# Patient Record
Sex: Female | Born: 1970 | Race: White | Hispanic: No | Marital: Married | Smoking: Never smoker
Health system: Southern US, Community
[De-identification: ages and names within clinical notes are randomized; demographics above are authoritative.]

## PROBLEM LIST (undated history)

## (undated) DIAGNOSIS — K7689 Other specified diseases of liver: Secondary | ICD-10-CM

## (undated) HISTORY — PX: APPENDECTOMY: SHX54

## (undated) HISTORY — DX: Other specified diseases of liver: K76.89

## (undated) HISTORY — PX: STAPEDOTOMY: SHX2437

---

## 2001-10-16 ENCOUNTER — Other Ambulatory Visit: Admission: RE | Admit: 2001-10-16 | Discharge: 2001-10-16 | Payer: Self-pay | Admitting: Obstetrics and Gynecology

## 2002-12-26 ENCOUNTER — Other Ambulatory Visit: Admission: RE | Admit: 2002-12-26 | Discharge: 2002-12-26 | Payer: Self-pay | Admitting: Obstetrics and Gynecology

## 2004-02-25 ENCOUNTER — Other Ambulatory Visit: Admission: RE | Admit: 2004-02-25 | Discharge: 2004-02-25 | Payer: Self-pay | Admitting: Obstetrics and Gynecology

## 2005-03-24 ENCOUNTER — Other Ambulatory Visit: Admission: RE | Admit: 2005-03-24 | Discharge: 2005-03-24 | Payer: Self-pay | Admitting: Obstetrics and Gynecology

## 2013-10-15 ENCOUNTER — Ambulatory Visit (INDEPENDENT_AMBULATORY_CARE_PROVIDER_SITE_OTHER): Payer: BC Managed Care – PPO | Admitting: Sports Medicine

## 2013-10-15 ENCOUNTER — Ambulatory Visit
Admission: RE | Admit: 2013-10-15 | Discharge: 2013-10-15 | Disposition: A | Discharge status: Home / Self Care | Payer: BC Managed Care – PPO | Source: Ambulatory Visit | Attending: Sports Medicine | Admitting: Sports Medicine

## 2013-10-15 ENCOUNTER — Encounter: Payer: Self-pay | Admitting: Sports Medicine

## 2013-10-15 VITALS — BP 115/79 | HR 75 | Ht 69.0 in | Wt 170.0 lb

## 2013-10-15 DIAGNOSIS — M25561 Pain in right knee: Secondary | ICD-10-CM

## 2013-10-15 DIAGNOSIS — M25569 Pain in unspecified knee: Secondary | ICD-10-CM

## 2013-10-15 NOTE — Progress Notes (Addendum)
   Subjective:    Patient ID: Michele Mccarthy, female    DOB: [birth year], 43 y.o.   MRN: [id number]  Knee Pain   43 y.o. female presenting with several years right knee pain that has acutely worsened in the past 2-3 weeks. The pain is described as sharp, located infralateral patellar region, some radiation down leg, not to toes. 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 She also went for a walk 2 weeks prior at which time the pain was so bad she had to stop. After that episode she did ice and elevate her knee - which provided some relief. Pain exacerbated by knee flexion, weight-bearing. Denies numbness, tingling, trauma, fevers, change in activity. She has noticed some swelling over her area of discomfort.   Review of Systems Per HPI    Objective:   Physical Exam Filed Vitals:   10/15/13 1413  BP: 115/79  Pulse: 75  Gen: Well-developed, well-nourished individual in NAD, AAOx3 Knee: No erythema or effusion or obvious bony abnormalities.  Palpation with no warmth, no patellar, condylar tenderness, + lateral joint line tenderness ROM normal in flexion and extension and lower leg rotation; no crepitus Ligaments with solid consistent endpoints including ACL, PCL, LCL, MCL. Negative Mcmurray's and provocative meniscal tests. Non painful patellar compression. Patellar and quadriceps tendons unremarkable. Hamstring and quadriceps strength is normal. Weakness noted to hip abductors, most pronounced in gluteus medius  Gait assessment reveals pronation     Assessment & Plan:  Lahari was seen today for knee pain.  Diagnoses and associated orders for this visit:  Right knee pain Concern for right lateral meniscal involvement versus osteoarthritic process - Obtain R Knee AP/LAT W/Sunrise Right - Give right knee compression sleeve - Provide handout with hip abductor, hamstring,  quadriceps exercises - Have patient return in 4 weeks - Will review films At follow-up if improved, continue current regimen of exercises, sleeve; if worsened / no improvement then consider diagnostic intra-articular cortisone injection. Patient will also bring in a pair of walking shoes at followup (she only had sandals with her today). She may benefit from some arch support as well.   Joseph Berkshire, MD [facility] PGY-3  Addendum: X-rays reviewed. They're unremarkable. No significant degenerative changes seen.

## 2013-11-12 ENCOUNTER — Ambulatory Visit: Payer: BC Managed Care – PPO | Admitting: Sports Medicine

## 2015-06-27 DIAGNOSIS — Z79899 Other long term (current) drug therapy: Secondary | ICD-10-CM | POA: Diagnosis not present

## 2015-06-27 DIAGNOSIS — E039 Hypothyroidism, unspecified: Secondary | ICD-10-CM | POA: Diagnosis not present

## 2015-06-27 DIAGNOSIS — Z13220 Encounter for screening for lipoid disorders: Secondary | ICD-10-CM | POA: Diagnosis not present

## 2015-06-27 DIAGNOSIS — Z0000 Encounter for general adult medical examination without abnormal findings: Secondary | ICD-10-CM | POA: Diagnosis not present

## 2015-06-27 DIAGNOSIS — D649 Anemia, unspecified: Secondary | ICD-10-CM | POA: Diagnosis not present

## 2015-06-27 DIAGNOSIS — E538 Deficiency of other specified B group vitamins: Secondary | ICD-10-CM | POA: Diagnosis not present

## 2015-11-24 DIAGNOSIS — Z6828 Body mass index (BMI) 28.0-28.9, adult: Secondary | ICD-10-CM | POA: Diagnosis not present

## 2015-11-24 DIAGNOSIS — Z1231 Encounter for screening mammogram for malignant neoplasm of breast: Secondary | ICD-10-CM | POA: Diagnosis not present

## 2015-11-24 DIAGNOSIS — Z01419 Encounter for gynecological examination (general) (routine) without abnormal findings: Secondary | ICD-10-CM | POA: Diagnosis not present

## 2015-11-26 DIAGNOSIS — R1031 Right lower quadrant pain: Secondary | ICD-10-CM | POA: Diagnosis not present

## 2015-12-16 DIAGNOSIS — L723 Sebaceous cyst: Secondary | ICD-10-CM | POA: Diagnosis not present

## 2015-12-16 DIAGNOSIS — L304 Erythema intertrigo: Secondary | ICD-10-CM | POA: Diagnosis not present

## 2015-12-16 DIAGNOSIS — B078 Other viral warts: Secondary | ICD-10-CM | POA: Diagnosis not present

## 2016-08-13 DIAGNOSIS — Z0000 Encounter for general adult medical examination without abnormal findings: Secondary | ICD-10-CM | POA: Diagnosis not present

## 2016-08-13 DIAGNOSIS — Z79899 Other long term (current) drug therapy: Secondary | ICD-10-CM | POA: Diagnosis not present

## 2016-08-13 DIAGNOSIS — E039 Hypothyroidism, unspecified: Secondary | ICD-10-CM | POA: Diagnosis not present

## 2016-09-28 DIAGNOSIS — J069 Acute upper respiratory infection, unspecified: Secondary | ICD-10-CM | POA: Diagnosis not present

## 2016-11-24 DIAGNOSIS — Z01419 Encounter for gynecological examination (general) (routine) without abnormal findings: Secondary | ICD-10-CM | POA: Diagnosis not present

## 2016-11-24 DIAGNOSIS — Z6828 Body mass index (BMI) 28.0-28.9, adult: Secondary | ICD-10-CM | POA: Diagnosis not present

## 2016-11-25 ENCOUNTER — Other Ambulatory Visit: Payer: Self-pay | Admitting: Obstetrics and Gynecology

## 2016-11-25 DIAGNOSIS — N6459 Other signs and symptoms in breast: Secondary | ICD-10-CM

## 2016-11-30 ENCOUNTER — Ambulatory Visit
Admission: RE | Admit: 2016-11-30 | Discharge: 2016-11-30 | Disposition: A | Discharge status: Home / Self Care | Payer: BLUE CROSS/BLUE SHIELD | Source: Ambulatory Visit | Attending: Obstetrics and Gynecology | Admitting: Obstetrics and Gynecology

## 2016-11-30 DIAGNOSIS — N6489 Other specified disorders of breast: Secondary | ICD-10-CM | POA: Diagnosis not present

## 2016-11-30 DIAGNOSIS — R922 Inconclusive mammogram: Secondary | ICD-10-CM | POA: Diagnosis not present

## 2016-11-30 DIAGNOSIS — N6459 Other signs and symptoms in breast: Secondary | ICD-10-CM

## 2016-12-15 DIAGNOSIS — Z86010 Personal history of colonic polyps: Secondary | ICD-10-CM | POA: Diagnosis not present

## 2016-12-15 DIAGNOSIS — D126 Benign neoplasm of colon, unspecified: Secondary | ICD-10-CM | POA: Diagnosis not present

## 2016-12-15 DIAGNOSIS — K621 Rectal polyp: Secondary | ICD-10-CM | POA: Diagnosis not present

## 2016-12-21 DIAGNOSIS — D126 Benign neoplasm of colon, unspecified: Secondary | ICD-10-CM | POA: Diagnosis not present

## 2017-06-09 DIAGNOSIS — Z86018 Personal history of other benign neoplasm: Secondary | ICD-10-CM | POA: Diagnosis not present

## 2017-06-09 DIAGNOSIS — L814 Other melanin hyperpigmentation: Secondary | ICD-10-CM | POA: Diagnosis not present

## 2017-06-09 DIAGNOSIS — D2262 Melanocytic nevi of left upper limb, including shoulder: Secondary | ICD-10-CM | POA: Diagnosis not present

## 2017-06-09 DIAGNOSIS — Z808 Family history of malignant neoplasm of other organs or systems: Secondary | ICD-10-CM | POA: Diagnosis not present

## 2017-07-13 DIAGNOSIS — M9901 Segmental and somatic dysfunction of cervical region: Secondary | ICD-10-CM | POA: Diagnosis not present

## 2017-07-13 DIAGNOSIS — M9905 Segmental and somatic dysfunction of pelvic region: Secondary | ICD-10-CM | POA: Diagnosis not present

## 2017-07-13 DIAGNOSIS — M9902 Segmental and somatic dysfunction of thoracic region: Secondary | ICD-10-CM | POA: Diagnosis not present

## 2017-07-13 DIAGNOSIS — M9903 Segmental and somatic dysfunction of lumbar region: Secondary | ICD-10-CM | POA: Diagnosis not present

## 2017-07-14 DIAGNOSIS — M9903 Segmental and somatic dysfunction of lumbar region: Secondary | ICD-10-CM | POA: Diagnosis not present

## 2017-07-14 DIAGNOSIS — M9901 Segmental and somatic dysfunction of cervical region: Secondary | ICD-10-CM | POA: Diagnosis not present

## 2017-07-14 DIAGNOSIS — M9902 Segmental and somatic dysfunction of thoracic region: Secondary | ICD-10-CM | POA: Diagnosis not present

## 2017-07-14 DIAGNOSIS — M9905 Segmental and somatic dysfunction of pelvic region: Secondary | ICD-10-CM | POA: Diagnosis not present

## 2017-07-18 DIAGNOSIS — M9903 Segmental and somatic dysfunction of lumbar region: Secondary | ICD-10-CM | POA: Diagnosis not present

## 2017-07-18 DIAGNOSIS — M9905 Segmental and somatic dysfunction of pelvic region: Secondary | ICD-10-CM | POA: Diagnosis not present

## 2017-07-18 DIAGNOSIS — M9902 Segmental and somatic dysfunction of thoracic region: Secondary | ICD-10-CM | POA: Diagnosis not present

## 2017-07-18 DIAGNOSIS — M9901 Segmental and somatic dysfunction of cervical region: Secondary | ICD-10-CM | POA: Diagnosis not present

## 2017-07-20 DIAGNOSIS — M9901 Segmental and somatic dysfunction of cervical region: Secondary | ICD-10-CM | POA: Diagnosis not present

## 2017-07-20 DIAGNOSIS — M9905 Segmental and somatic dysfunction of pelvic region: Secondary | ICD-10-CM | POA: Diagnosis not present

## 2017-07-20 DIAGNOSIS — M9903 Segmental and somatic dysfunction of lumbar region: Secondary | ICD-10-CM | POA: Diagnosis not present

## 2017-07-20 DIAGNOSIS — M9902 Segmental and somatic dysfunction of thoracic region: Secondary | ICD-10-CM | POA: Diagnosis not present

## 2017-07-25 DIAGNOSIS — M9902 Segmental and somatic dysfunction of thoracic region: Secondary | ICD-10-CM | POA: Diagnosis not present

## 2017-07-25 DIAGNOSIS — M9901 Segmental and somatic dysfunction of cervical region: Secondary | ICD-10-CM | POA: Diagnosis not present

## 2017-07-25 DIAGNOSIS — M9903 Segmental and somatic dysfunction of lumbar region: Secondary | ICD-10-CM | POA: Diagnosis not present

## 2017-07-25 DIAGNOSIS — M9905 Segmental and somatic dysfunction of pelvic region: Secondary | ICD-10-CM | POA: Diagnosis not present

## 2017-07-27 DIAGNOSIS — M9901 Segmental and somatic dysfunction of cervical region: Secondary | ICD-10-CM | POA: Diagnosis not present

## 2017-07-27 DIAGNOSIS — M9902 Segmental and somatic dysfunction of thoracic region: Secondary | ICD-10-CM | POA: Diagnosis not present

## 2017-07-27 DIAGNOSIS — M9905 Segmental and somatic dysfunction of pelvic region: Secondary | ICD-10-CM | POA: Diagnosis not present

## 2017-07-27 DIAGNOSIS — M9903 Segmental and somatic dysfunction of lumbar region: Secondary | ICD-10-CM | POA: Diagnosis not present

## 2017-08-01 DIAGNOSIS — M9905 Segmental and somatic dysfunction of pelvic region: Secondary | ICD-10-CM | POA: Diagnosis not present

## 2017-08-01 DIAGNOSIS — M9901 Segmental and somatic dysfunction of cervical region: Secondary | ICD-10-CM | POA: Diagnosis not present

## 2017-08-01 DIAGNOSIS — M9903 Segmental and somatic dysfunction of lumbar region: Secondary | ICD-10-CM | POA: Diagnosis not present

## 2017-08-01 DIAGNOSIS — M9902 Segmental and somatic dysfunction of thoracic region: Secondary | ICD-10-CM | POA: Diagnosis not present

## 2017-08-03 DIAGNOSIS — M9901 Segmental and somatic dysfunction of cervical region: Secondary | ICD-10-CM | POA: Diagnosis not present

## 2017-08-03 DIAGNOSIS — M9903 Segmental and somatic dysfunction of lumbar region: Secondary | ICD-10-CM | POA: Diagnosis not present

## 2017-08-03 DIAGNOSIS — M9902 Segmental and somatic dysfunction of thoracic region: Secondary | ICD-10-CM | POA: Diagnosis not present

## 2017-08-03 DIAGNOSIS — M9905 Segmental and somatic dysfunction of pelvic region: Secondary | ICD-10-CM | POA: Diagnosis not present

## 2017-08-08 DIAGNOSIS — M9902 Segmental and somatic dysfunction of thoracic region: Secondary | ICD-10-CM | POA: Diagnosis not present

## 2017-08-08 DIAGNOSIS — M9901 Segmental and somatic dysfunction of cervical region: Secondary | ICD-10-CM | POA: Diagnosis not present

## 2017-08-08 DIAGNOSIS — M9905 Segmental and somatic dysfunction of pelvic region: Secondary | ICD-10-CM | POA: Diagnosis not present

## 2017-08-08 DIAGNOSIS — M9903 Segmental and somatic dysfunction of lumbar region: Secondary | ICD-10-CM | POA: Diagnosis not present

## 2017-10-05 DIAGNOSIS — E039 Hypothyroidism, unspecified: Secondary | ICD-10-CM | POA: Diagnosis not present

## 2017-10-05 DIAGNOSIS — E538 Deficiency of other specified B group vitamins: Secondary | ICD-10-CM | POA: Diagnosis not present

## 2017-10-05 DIAGNOSIS — Z0000 Encounter for general adult medical examination without abnormal findings: Secondary | ICD-10-CM | POA: Diagnosis not present

## 2017-10-18 DIAGNOSIS — E538 Deficiency of other specified B group vitamins: Secondary | ICD-10-CM | POA: Diagnosis not present

## 2017-11-01 DIAGNOSIS — E538 Deficiency of other specified B group vitamins: Secondary | ICD-10-CM | POA: Diagnosis not present

## 2017-11-25 DIAGNOSIS — E039 Hypothyroidism, unspecified: Secondary | ICD-10-CM | POA: Diagnosis not present

## 2017-11-25 DIAGNOSIS — E538 Deficiency of other specified B group vitamins: Secondary | ICD-10-CM | POA: Diagnosis not present

## 2017-12-01 DIAGNOSIS — Z8042 Family history of malignant neoplasm of prostate: Secondary | ICD-10-CM | POA: Diagnosis not present

## 2017-12-01 DIAGNOSIS — Z801 Family history of malignant neoplasm of trachea, bronchus and lung: Secondary | ICD-10-CM | POA: Diagnosis not present

## 2017-12-01 DIAGNOSIS — Z6826 Body mass index (BMI) 26.0-26.9, adult: Secondary | ICD-10-CM | POA: Diagnosis not present

## 2017-12-01 DIAGNOSIS — Z1231 Encounter for screening mammogram for malignant neoplasm of breast: Secondary | ICD-10-CM | POA: Diagnosis not present

## 2017-12-01 DIAGNOSIS — Z808 Family history of malignant neoplasm of other organs or systems: Secondary | ICD-10-CM | POA: Diagnosis not present

## 2017-12-01 DIAGNOSIS — Z01419 Encounter for gynecological examination (general) (routine) without abnormal findings: Secondary | ICD-10-CM | POA: Diagnosis not present

## 2017-12-01 DIAGNOSIS — Z86010 Personal history of colonic polyps: Secondary | ICD-10-CM | POA: Diagnosis not present

## 2018-01-11 DIAGNOSIS — Z809 Family history of malignant neoplasm, unspecified: Secondary | ICD-10-CM | POA: Diagnosis not present

## 2018-01-20 ENCOUNTER — Other Ambulatory Visit: Payer: Self-pay | Admitting: Family Medicine

## 2018-02-08 DIAGNOSIS — E039 Hypothyroidism, unspecified: Secondary | ICD-10-CM | POA: Diagnosis not present

## 2018-04-12 DIAGNOSIS — E039 Hypothyroidism, unspecified: Secondary | ICD-10-CM | POA: Diagnosis not present

## 2018-06-29 DIAGNOSIS — D225 Melanocytic nevi of trunk: Secondary | ICD-10-CM | POA: Diagnosis not present

## 2018-06-29 DIAGNOSIS — D220 Melanocytic nevi of lip: Secondary | ICD-10-CM | POA: Diagnosis not present

## 2018-06-29 DIAGNOSIS — L821 Other seborrheic keratosis: Secondary | ICD-10-CM | POA: Diagnosis not present

## 2018-06-29 DIAGNOSIS — L814 Other melanin hyperpigmentation: Secondary | ICD-10-CM | POA: Diagnosis not present

## 2018-07-10 DIAGNOSIS — B029 Zoster without complications: Secondary | ICD-10-CM | POA: Diagnosis not present

## 2018-09-25 ENCOUNTER — Other Ambulatory Visit: Payer: Self-pay

## 2018-09-25 DIAGNOSIS — Z20822 Contact with and (suspected) exposure to covid-19: Secondary | ICD-10-CM

## 2018-09-25 DIAGNOSIS — R6889 Other general symptoms and signs: Secondary | ICD-10-CM | POA: Diagnosis not present

## 2018-09-26 LAB — NOVEL CORONAVIRUS, NAA: SARS-CoV-2, NAA: DETECTED — AB

## 2018-11-24 DIAGNOSIS — Z13220 Encounter for screening for lipoid disorders: Secondary | ICD-10-CM | POA: Diagnosis not present

## 2018-11-24 DIAGNOSIS — E538 Deficiency of other specified B group vitamins: Secondary | ICD-10-CM | POA: Diagnosis not present

## 2018-11-24 DIAGNOSIS — Z0000 Encounter for general adult medical examination without abnormal findings: Secondary | ICD-10-CM | POA: Diagnosis not present

## 2018-11-24 DIAGNOSIS — E611 Iron deficiency: Secondary | ICD-10-CM | POA: Diagnosis not present

## 2018-11-24 DIAGNOSIS — Z79899 Other long term (current) drug therapy: Secondary | ICD-10-CM | POA: Diagnosis not present

## 2018-11-24 DIAGNOSIS — E039 Hypothyroidism, unspecified: Secondary | ICD-10-CM | POA: Diagnosis not present

## 2018-12-12 DIAGNOSIS — Z01419 Encounter for gynecological examination (general) (routine) without abnormal findings: Secondary | ICD-10-CM | POA: Diagnosis not present

## 2018-12-12 DIAGNOSIS — Z1231 Encounter for screening mammogram for malignant neoplasm of breast: Secondary | ICD-10-CM | POA: Diagnosis not present

## 2018-12-12 DIAGNOSIS — Z6826 Body mass index (BMI) 26.0-26.9, adult: Secondary | ICD-10-CM | POA: Diagnosis not present

## 2018-12-19 DIAGNOSIS — Z23 Encounter for immunization: Secondary | ICD-10-CM | POA: Diagnosis not present

## 2019-02-20 DIAGNOSIS — Z23 Encounter for immunization: Secondary | ICD-10-CM | POA: Diagnosis not present

## 2019-03-07 DIAGNOSIS — Z20828 Contact with and (suspected) exposure to other viral communicable diseases: Secondary | ICD-10-CM | POA: Diagnosis not present

## 2019-03-07 DIAGNOSIS — Z03818 Encounter for observation for suspected exposure to other biological agents ruled out: Secondary | ICD-10-CM | POA: Diagnosis not present

## 2019-03-22 DIAGNOSIS — Z20828 Contact with and (suspected) exposure to other viral communicable diseases: Secondary | ICD-10-CM | POA: Diagnosis not present

## 2019-03-25 DIAGNOSIS — Z20828 Contact with and (suspected) exposure to other viral communicable diseases: Secondary | ICD-10-CM | POA: Diagnosis not present

## 2019-03-30 DIAGNOSIS — Z20828 Contact with and (suspected) exposure to other viral communicable diseases: Secondary | ICD-10-CM | POA: Diagnosis not present

## 2019-04-19 IMAGING — US ULTRASOUND LEFT BREAST LIMITED
1 series · 2 of 2 positions shown · non-contrast
Comparison: Mammography 11/24/2015, 11/20/2014 and earlier.

CLINICAL DATA: Palpable thickening in the upper inner left breast
which the patient states that she also noted for approximately 4
months, confirmed on recent clinical examination. Focal pain was
present initially in this location, though the pain has resolved.
Annual evaluation, right breast.

EXAM:
2D DIGITAL DIAGNOSTIC BILATERAL MAMMOGRAM WITH CAD AND ADJUNCT TOMO
ULTRASOUND LEFT BREAST

[Series 1: ultrasound left breast limited · 0.05mm/px · 2 of 2 slices shown]
[im 1/2]
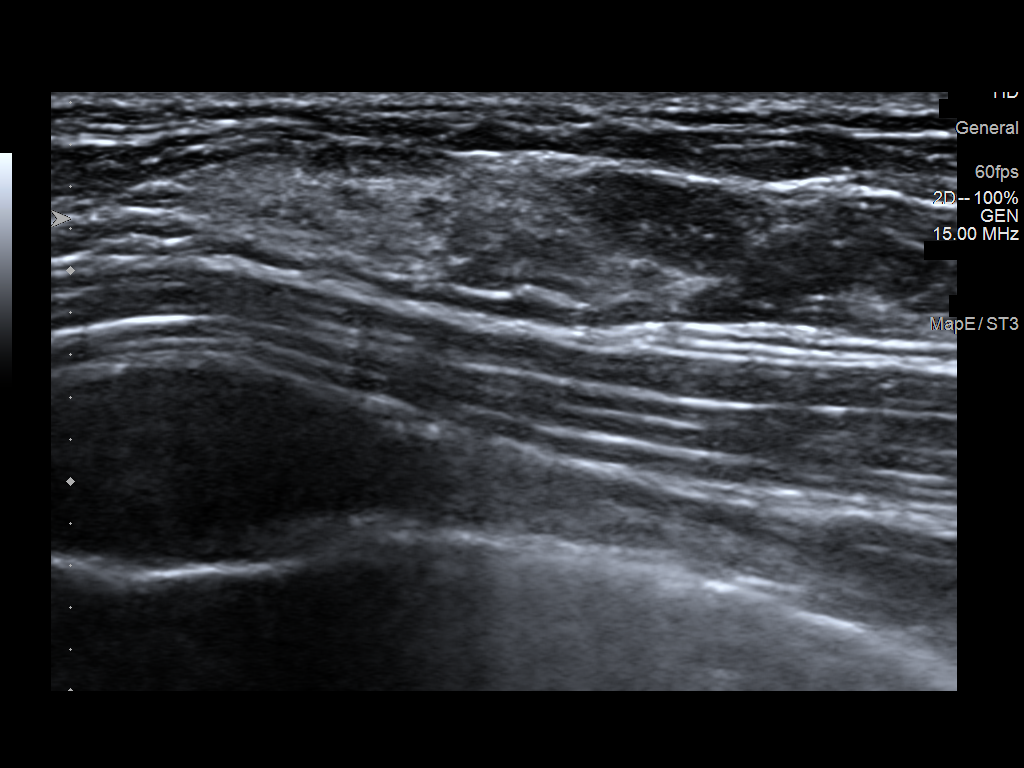
[im 2/2]
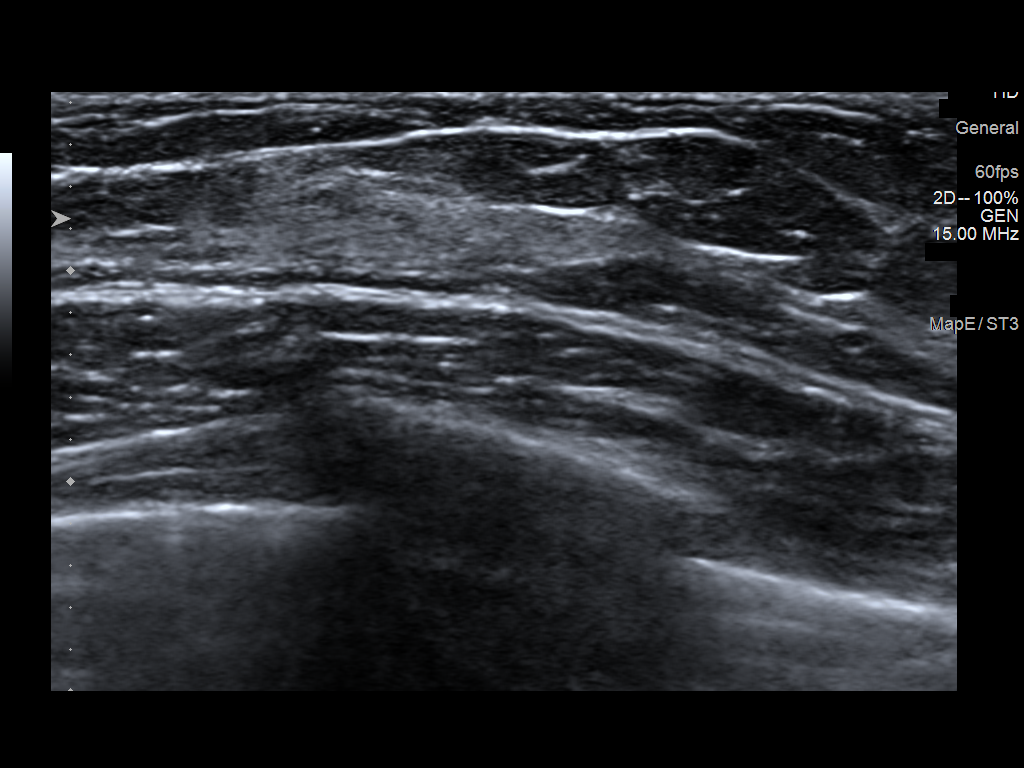

[2 of 2 positions shown; findings below may reference images not displayed]

No
prior ultrasound.

ACR Breast Density Category c: The breast tissue is heterogeneously
dense, which may obscure small masses.
FINDINGS: Standard 2D and tomosynthesis full field CC and MLO views of both
breasts were obtained. A standard and tomosynthesis spot in the area
of palpable thickening in the left breast was also obtained.

No mammographic abnormality in the area of palpable concern in the
upper inner left breast.

No findings suspicious for malignancy in either breast.

Mammographic images were processed with CAD.

On physical exam, there is a small focus of palpable thickening in
the upper inner left breast at posterior depth corresponding to what
the patient is feeling, though I do not palpate a discrete mass.

Targeted left breast ultrasound is performed, showing an isolated
focus of normal fibroglandular tissue in the upper inner quadrant at
the 10 o'clock position approximately 8 cm from the nipple in the
area of palpable concern. No cyst, solid mass or abnormal acoustic
shadowing is identified.
IMPRESSION: 1. No mammographic or sonographic evidence of malignancy involving
the left breast. An island of normal fibroglandular tissue
corresponds to the area of palpable concern.
2. No mammographic evidence of malignancy involving the right
breast.

RECOMMENDATION:
Screening mammogram in one year.(Code:4F-N-DLR)

I have discussed the findings and recommendations with the patient.
Results were also provided in writing at the conclusion of the
visit. If applicable, a reminder letter will be sent to the patient
regarding the next appointment.

BI-RADS CATEGORY  1: Negative.

## 2019-04-19 IMAGING — MG 2D DIGITAL DIAGNOSTIC BILATERAL MAMMOGRAM WITH CAD AND ADJUNCT T
8 of 15 series · 8 of 35 positions shown · non-contrast
Comparison: Mammography 11/24/2015, 11/20/2014 and earlier.

CLINICAL DATA: Palpable thickening in the upper inner left breast
which the patient states that she also noted for approximately 4
months, confirmed on recent clinical examination. Focal pain was
present initially in this location, though the pain has resolved.
Annual evaluation, right breast.

EXAM:
2D DIGITAL DIAGNOSTIC BILATERAL MAMMOGRAM WITH CAD AND ADJUNCT TOMO
ULTRASOUND LEFT BREAST

[L MLO synth-2D]
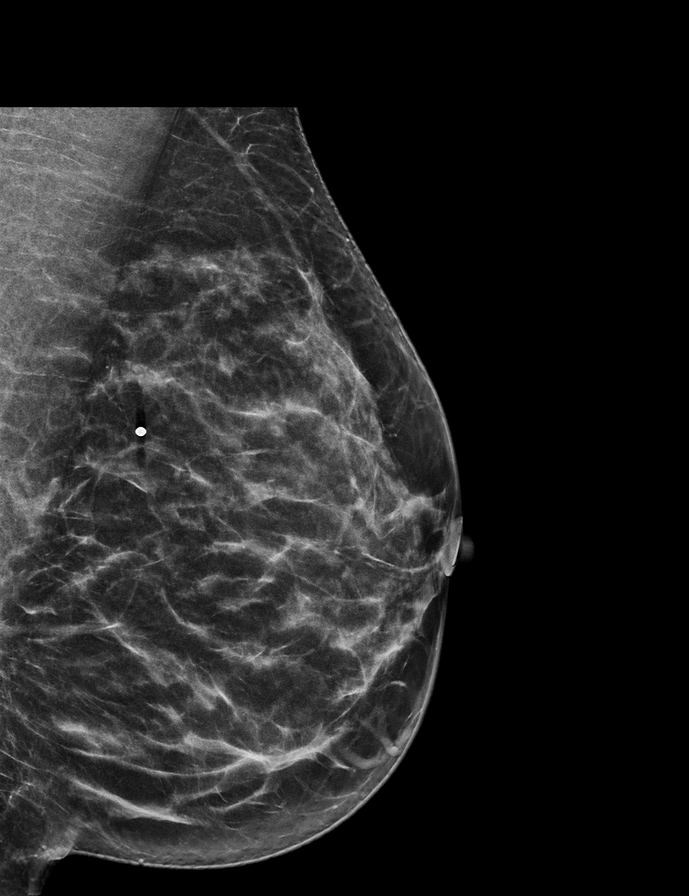

[L TAN synth-2D]
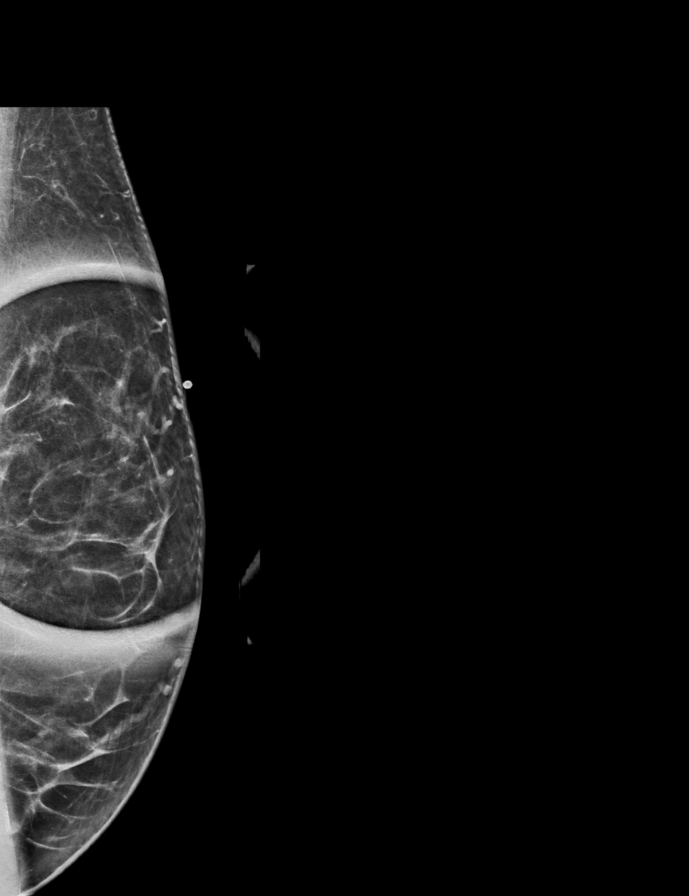

[R CC synth-2D]
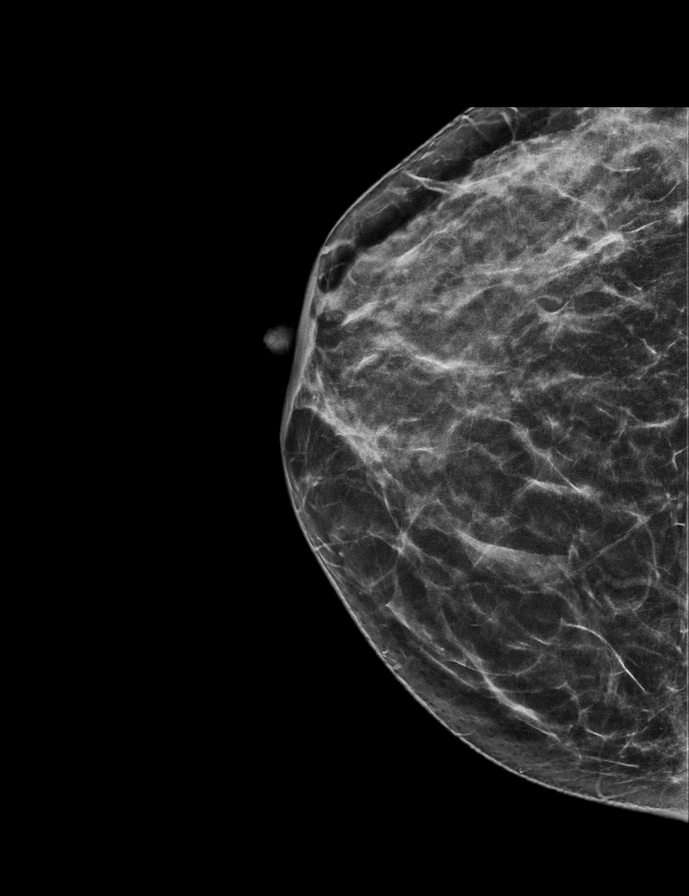

[L CC]
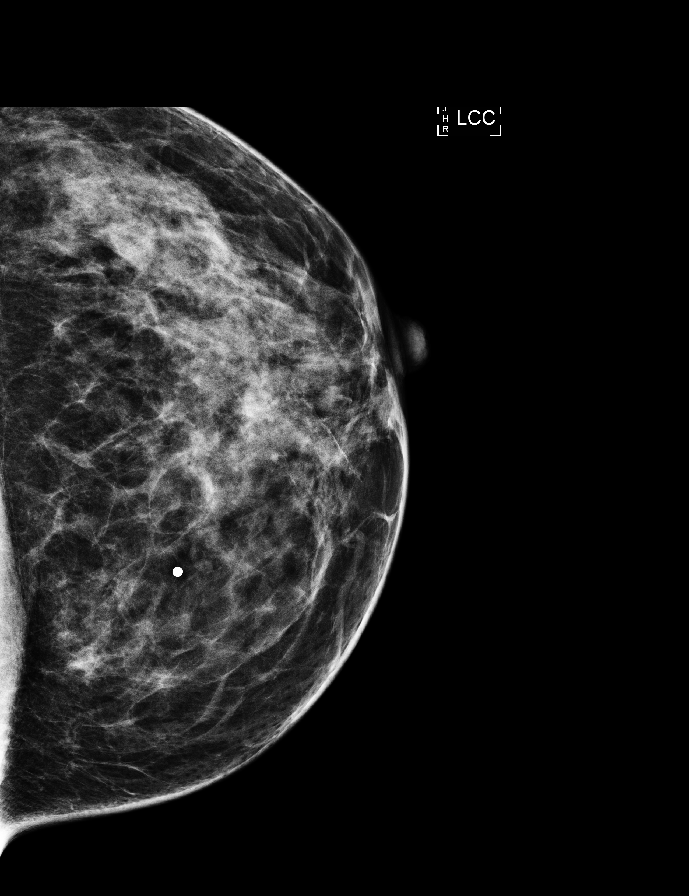

[R MLO synth-2D]
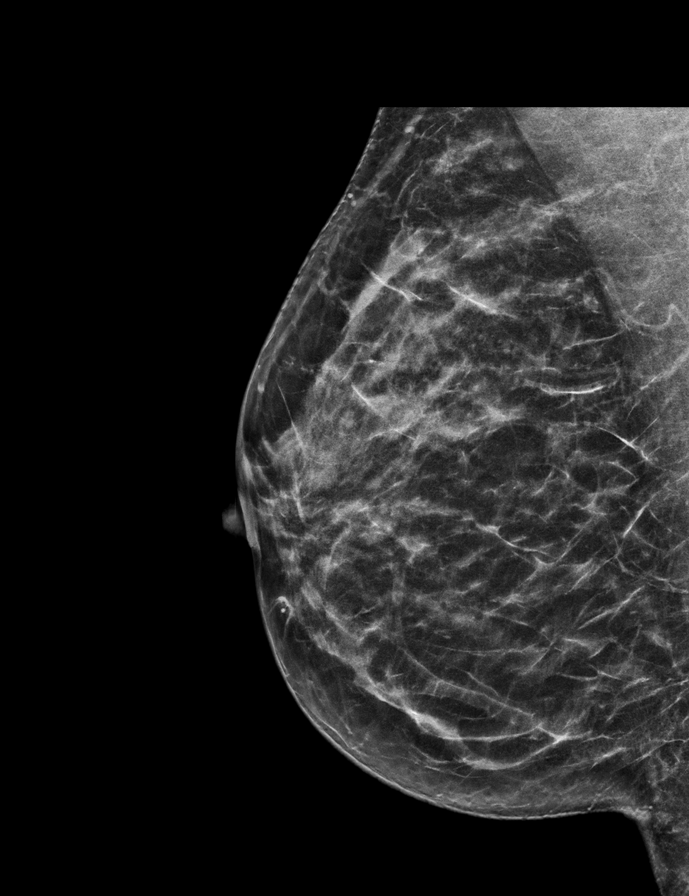

[L TAN]
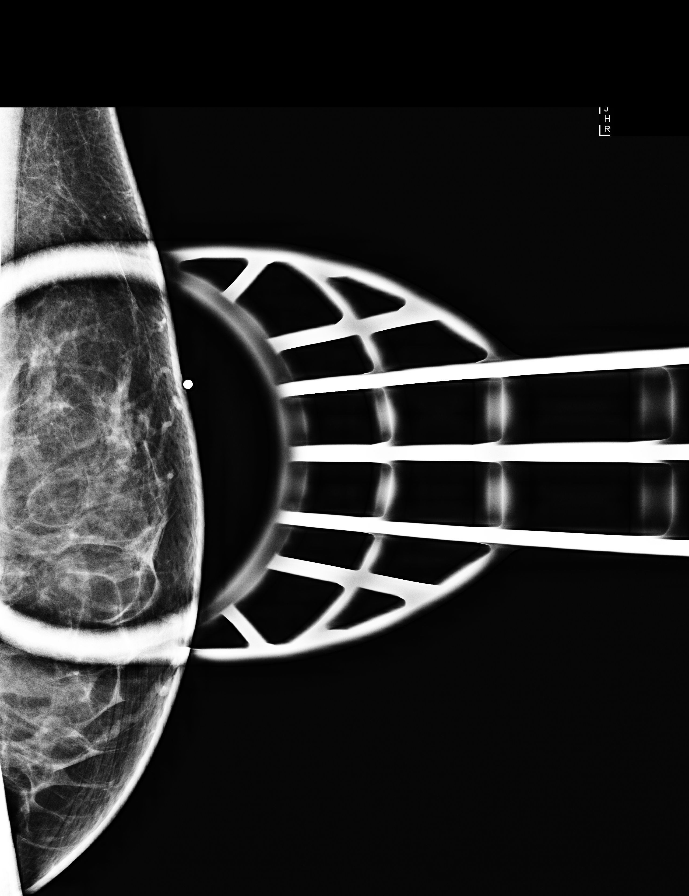

[L MLO]
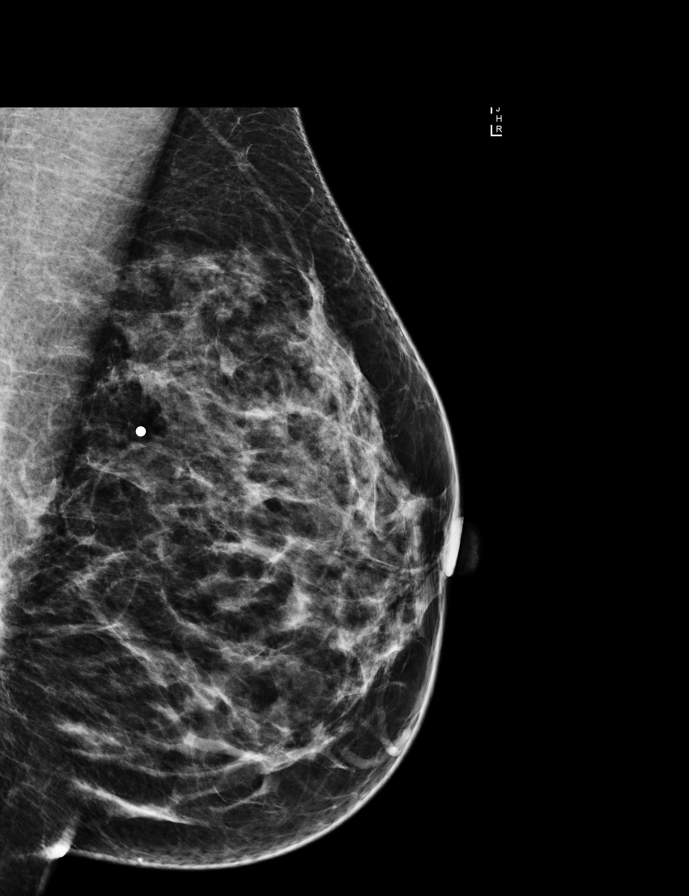

[L CC synth-2D]
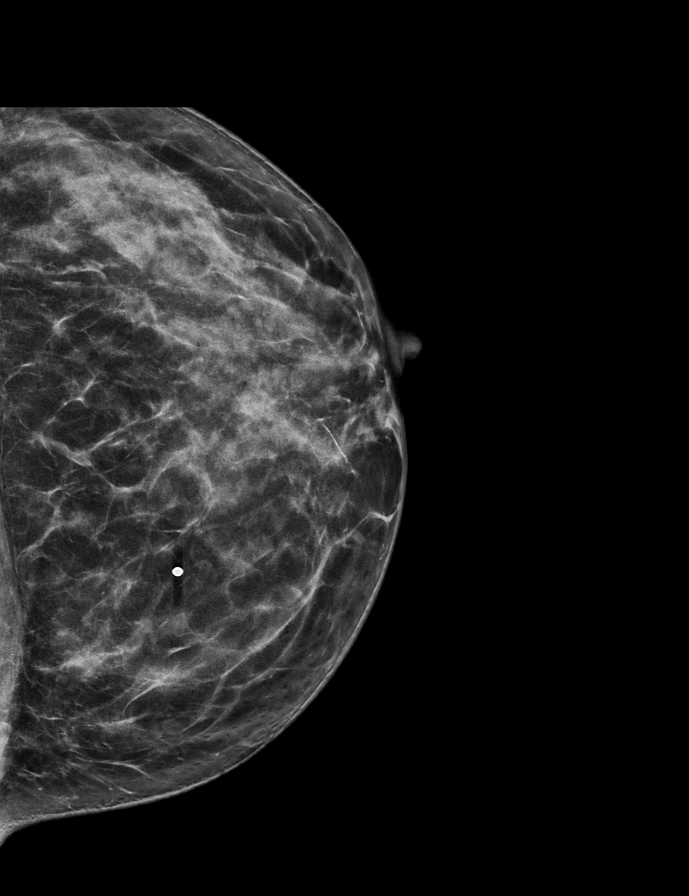

[8 of 35 positions shown; findings below may reference images not displayed]

No
prior ultrasound.

ACR Breast Density Category c: The breast tissue is heterogeneously
dense, which may obscure small masses.
FINDINGS: Standard 2D and tomosynthesis full field CC and MLO views of both
breasts were obtained. A standard and tomosynthesis spot in the area
of palpable thickening in the left breast was also obtained.

No mammographic abnormality in the area of palpable concern in the
upper inner left breast.

No findings suspicious for malignancy in either breast.

Mammographic images were processed with CAD.

On physical exam, there is a small focus of palpable thickening in
the upper inner left breast at posterior depth corresponding to what
the patient is feeling, though I do not palpate a discrete mass.

Targeted left breast ultrasound is performed, showing an isolated
focus of normal fibroglandular tissue in the upper inner quadrant at
the 10 o'clock position approximately 8 cm from the nipple in the
area of palpable concern. No cyst, solid mass or abnormal acoustic
shadowing is identified.
IMPRESSION: 1. No mammographic or sonographic evidence of malignancy involving
the left breast. An island of normal fibroglandular tissue
corresponds to the area of palpable concern.
2. No mammographic evidence of malignancy involving the right
breast.

RECOMMENDATION:
Screening mammogram in one year.(Code:4F-N-DLR)

I have discussed the findings and recommendations with the patient.
Results were also provided in writing at the conclusion of the
visit. If applicable, a reminder letter will be sent to the patient
regarding the next appointment.

BI-RADS CATEGORY  1: Negative.

## 2019-07-03 DIAGNOSIS — D225 Melanocytic nevi of trunk: Secondary | ICD-10-CM | POA: Diagnosis not present

## 2019-07-03 DIAGNOSIS — L578 Other skin changes due to chronic exposure to nonionizing radiation: Secondary | ICD-10-CM | POA: Diagnosis not present

## 2019-07-03 DIAGNOSIS — L821 Other seborrheic keratosis: Secondary | ICD-10-CM | POA: Diagnosis not present

## 2019-07-03 DIAGNOSIS — L720 Epidermal cyst: Secondary | ICD-10-CM | POA: Diagnosis not present

## 2019-09-11 DIAGNOSIS — N390 Urinary tract infection, site not specified: Secondary | ICD-10-CM | POA: Diagnosis not present

## 2019-09-11 DIAGNOSIS — R319 Hematuria, unspecified: Secondary | ICD-10-CM | POA: Diagnosis not present

## 2019-09-25 DIAGNOSIS — N3000 Acute cystitis without hematuria: Secondary | ICD-10-CM | POA: Diagnosis not present

## 2019-09-26 DIAGNOSIS — Z1152 Encounter for screening for COVID-19: Secondary | ICD-10-CM | POA: Diagnosis not present

## 2019-09-26 DIAGNOSIS — Z20822 Contact with and (suspected) exposure to covid-19: Secondary | ICD-10-CM | POA: Diagnosis not present

## 2019-10-03 DIAGNOSIS — Z20822 Contact with and (suspected) exposure to covid-19: Secondary | ICD-10-CM | POA: Diagnosis not present

## 2019-10-11 DIAGNOSIS — Z20822 Contact with and (suspected) exposure to covid-19: Secondary | ICD-10-CM | POA: Diagnosis not present

## 2019-12-03 DIAGNOSIS — Z0000 Encounter for general adult medical examination without abnormal findings: Secondary | ICD-10-CM | POA: Diagnosis not present

## 2019-12-03 DIAGNOSIS — E039 Hypothyroidism, unspecified: Secondary | ICD-10-CM | POA: Diagnosis not present

## 2019-12-03 DIAGNOSIS — Z79899 Other long term (current) drug therapy: Secondary | ICD-10-CM | POA: Diagnosis not present

## 2019-12-03 DIAGNOSIS — D649 Anemia, unspecified: Secondary | ICD-10-CM | POA: Diagnosis not present

## 2019-12-03 DIAGNOSIS — Z13220 Encounter for screening for lipoid disorders: Secondary | ICD-10-CM | POA: Diagnosis not present

## 2019-12-03 DIAGNOSIS — E538 Deficiency of other specified B group vitamins: Secondary | ICD-10-CM | POA: Diagnosis not present

## 2019-12-11 DIAGNOSIS — E538 Deficiency of other specified B group vitamins: Secondary | ICD-10-CM | POA: Diagnosis not present

## 2019-12-13 DIAGNOSIS — Z1231 Encounter for screening mammogram for malignant neoplasm of breast: Secondary | ICD-10-CM | POA: Diagnosis not present

## 2019-12-13 DIAGNOSIS — Z6826 Body mass index (BMI) 26.0-26.9, adult: Secondary | ICD-10-CM | POA: Diagnosis not present

## 2019-12-13 DIAGNOSIS — Z01419 Encounter for gynecological examination (general) (routine) without abnormal findings: Secondary | ICD-10-CM | POA: Diagnosis not present

## 2019-12-14 ENCOUNTER — Other Ambulatory Visit: Payer: Self-pay | Admitting: Obstetrics and Gynecology

## 2019-12-14 DIAGNOSIS — E041 Nontoxic single thyroid nodule: Secondary | ICD-10-CM

## 2019-12-25 ENCOUNTER — Ambulatory Visit
Admission: RE | Admit: 2019-12-25 | Discharge: 2019-12-25 | Disposition: A | Discharge status: Home / Self Care | Payer: BLUE CROSS/BLUE SHIELD | Source: Ambulatory Visit | Attending: Obstetrics and Gynecology | Admitting: Obstetrics and Gynecology

## 2019-12-25 DIAGNOSIS — E041 Nontoxic single thyroid nodule: Secondary | ICD-10-CM

## 2020-02-07 DIAGNOSIS — D649 Anemia, unspecified: Secondary | ICD-10-CM | POA: Diagnosis not present

## 2020-02-07 DIAGNOSIS — E538 Deficiency of other specified B group vitamins: Secondary | ICD-10-CM | POA: Diagnosis not present

## 2020-04-26 DIAGNOSIS — Z20822 Contact with and (suspected) exposure to covid-19: Secondary | ICD-10-CM | POA: Diagnosis not present

## 2020-06-15 DIAGNOSIS — Z20828 Contact with and (suspected) exposure to other viral communicable diseases: Secondary | ICD-10-CM | POA: Diagnosis not present

## 2020-12-09 DIAGNOSIS — E538 Deficiency of other specified B group vitamins: Secondary | ICD-10-CM | POA: Diagnosis not present

## 2020-12-09 DIAGNOSIS — Z23 Encounter for immunization: Secondary | ICD-10-CM | POA: Diagnosis not present

## 2020-12-09 DIAGNOSIS — Z0000 Encounter for general adult medical examination without abnormal findings: Secondary | ICD-10-CM | POA: Diagnosis not present

## 2020-12-09 DIAGNOSIS — E039 Hypothyroidism, unspecified: Secondary | ICD-10-CM | POA: Diagnosis not present

## 2020-12-09 DIAGNOSIS — Z13220 Encounter for screening for lipoid disorders: Secondary | ICD-10-CM | POA: Diagnosis not present

## 2020-12-09 DIAGNOSIS — Z79899 Other long term (current) drug therapy: Secondary | ICD-10-CM | POA: Diagnosis not present

## 2021-01-07 DIAGNOSIS — M9901 Segmental and somatic dysfunction of cervical region: Secondary | ICD-10-CM | POA: Diagnosis not present

## 2021-01-07 DIAGNOSIS — M542 Cervicalgia: Secondary | ICD-10-CM | POA: Diagnosis not present

## 2021-01-07 DIAGNOSIS — M25511 Pain in right shoulder: Secondary | ICD-10-CM | POA: Diagnosis not present

## 2021-01-07 DIAGNOSIS — M9902 Segmental and somatic dysfunction of thoracic region: Secondary | ICD-10-CM | POA: Diagnosis not present

## 2021-01-14 DIAGNOSIS — Z1231 Encounter for screening mammogram for malignant neoplasm of breast: Secondary | ICD-10-CM | POA: Diagnosis not present

## 2021-01-14 DIAGNOSIS — Z01419 Encounter for gynecological examination (general) (routine) without abnormal findings: Secondary | ICD-10-CM | POA: Diagnosis not present

## 2021-01-14 DIAGNOSIS — Z6827 Body mass index (BMI) 27.0-27.9, adult: Secondary | ICD-10-CM | POA: Diagnosis not present

## 2021-01-26 DIAGNOSIS — M9902 Segmental and somatic dysfunction of thoracic region: Secondary | ICD-10-CM | POA: Diagnosis not present

## 2021-01-26 DIAGNOSIS — M542 Cervicalgia: Secondary | ICD-10-CM | POA: Diagnosis not present

## 2021-01-26 DIAGNOSIS — M9901 Segmental and somatic dysfunction of cervical region: Secondary | ICD-10-CM | POA: Diagnosis not present

## 2021-01-26 DIAGNOSIS — M25511 Pain in right shoulder: Secondary | ICD-10-CM | POA: Diagnosis not present

## 2021-02-23 DIAGNOSIS — M9904 Segmental and somatic dysfunction of sacral region: Secondary | ICD-10-CM | POA: Diagnosis not present

## 2021-02-23 DIAGNOSIS — M9903 Segmental and somatic dysfunction of lumbar region: Secondary | ICD-10-CM | POA: Diagnosis not present

## 2021-02-23 DIAGNOSIS — M9902 Segmental and somatic dysfunction of thoracic region: Secondary | ICD-10-CM | POA: Diagnosis not present

## 2021-02-23 DIAGNOSIS — M9901 Segmental and somatic dysfunction of cervical region: Secondary | ICD-10-CM | POA: Diagnosis not present

## 2021-03-23 DIAGNOSIS — M9902 Segmental and somatic dysfunction of thoracic region: Secondary | ICD-10-CM | POA: Diagnosis not present

## 2021-03-23 DIAGNOSIS — M9903 Segmental and somatic dysfunction of lumbar region: Secondary | ICD-10-CM | POA: Diagnosis not present

## 2021-03-23 DIAGNOSIS — M9901 Segmental and somatic dysfunction of cervical region: Secondary | ICD-10-CM | POA: Diagnosis not present

## 2021-03-23 DIAGNOSIS — M9904 Segmental and somatic dysfunction of sacral region: Secondary | ICD-10-CM | POA: Diagnosis not present

## 2021-03-30 DIAGNOSIS — M9901 Segmental and somatic dysfunction of cervical region: Secondary | ICD-10-CM | POA: Diagnosis not present

## 2021-03-30 DIAGNOSIS — M9902 Segmental and somatic dysfunction of thoracic region: Secondary | ICD-10-CM | POA: Diagnosis not present

## 2021-03-30 DIAGNOSIS — M9904 Segmental and somatic dysfunction of sacral region: Secondary | ICD-10-CM | POA: Diagnosis not present

## 2021-03-30 DIAGNOSIS — M9903 Segmental and somatic dysfunction of lumbar region: Secondary | ICD-10-CM | POA: Diagnosis not present

## 2021-04-01 DIAGNOSIS — Z13820 Encounter for screening for osteoporosis: Secondary | ICD-10-CM | POA: Diagnosis not present

## 2021-04-27 DIAGNOSIS — M9902 Segmental and somatic dysfunction of thoracic region: Secondary | ICD-10-CM | POA: Diagnosis not present

## 2021-04-27 DIAGNOSIS — M9901 Segmental and somatic dysfunction of cervical region: Secondary | ICD-10-CM | POA: Diagnosis not present

## 2021-04-27 DIAGNOSIS — M9903 Segmental and somatic dysfunction of lumbar region: Secondary | ICD-10-CM | POA: Diagnosis not present

## 2021-04-27 DIAGNOSIS — M9904 Segmental and somatic dysfunction of sacral region: Secondary | ICD-10-CM | POA: Diagnosis not present

## 2021-06-01 DIAGNOSIS — M9903 Segmental and somatic dysfunction of lumbar region: Secondary | ICD-10-CM | POA: Diagnosis not present

## 2021-06-01 DIAGNOSIS — M9901 Segmental and somatic dysfunction of cervical region: Secondary | ICD-10-CM | POA: Diagnosis not present

## 2021-06-01 DIAGNOSIS — M25572 Pain in left ankle and joints of left foot: Secondary | ICD-10-CM | POA: Diagnosis not present

## 2021-06-01 DIAGNOSIS — M9904 Segmental and somatic dysfunction of sacral region: Secondary | ICD-10-CM | POA: Diagnosis not present

## 2021-07-13 DIAGNOSIS — L578 Other skin changes due to chronic exposure to nonionizing radiation: Secondary | ICD-10-CM | POA: Diagnosis not present

## 2021-07-13 DIAGNOSIS — L814 Other melanin hyperpigmentation: Secondary | ICD-10-CM | POA: Diagnosis not present

## 2021-07-13 DIAGNOSIS — B078 Other viral warts: Secondary | ICD-10-CM | POA: Diagnosis not present

## 2021-07-13 DIAGNOSIS — L821 Other seborrheic keratosis: Secondary | ICD-10-CM | POA: Diagnosis not present

## 2021-07-13 DIAGNOSIS — D225 Melanocytic nevi of trunk: Secondary | ICD-10-CM | POA: Diagnosis not present

## 2021-07-13 DIAGNOSIS — L723 Sebaceous cyst: Secondary | ICD-10-CM | POA: Diagnosis not present

## 2021-09-07 DIAGNOSIS — S00501A Unspecified superficial injury of lip, initial encounter: Secondary | ICD-10-CM | POA: Diagnosis not present

## 2021-09-07 DIAGNOSIS — W540XXA Bitten by dog, initial encounter: Secondary | ICD-10-CM | POA: Insufficient documentation

## 2021-09-07 DIAGNOSIS — S01511A Laceration without foreign body of lip, initial encounter: Secondary | ICD-10-CM | POA: Diagnosis not present

## 2021-09-07 DIAGNOSIS — S01551A Open bite of lip, initial encounter: Secondary | ICD-10-CM | POA: Diagnosis not present

## 2021-09-08 ENCOUNTER — Encounter (HOSPITAL_BASED_OUTPATIENT_CLINIC_OR_DEPARTMENT_OTHER): Payer: Self-pay

## 2021-09-08 ENCOUNTER — Emergency Department (HOSPITAL_BASED_OUTPATIENT_CLINIC_OR_DEPARTMENT_OTHER)
Admission: EM | Admit: 2021-09-08 | Discharge: 2021-09-08 | Disposition: A | Discharge status: Home / Self Care | Payer: BC Managed Care – PPO | Attending: Emergency Medicine | Admitting: Emergency Medicine

## 2021-09-08 DIAGNOSIS — S01511A Laceration without foreign body of lip, initial encounter: Secondary | ICD-10-CM

## 2021-09-08 DIAGNOSIS — W540XXA Bitten by dog, initial encounter: Secondary | ICD-10-CM

## 2021-09-08 MED ORDER — AMOXICILLIN-POT CLAVULANATE 875-125 MG PO TABS: 1.0000 | ORAL_TABLET | Freq: Two times a day (BID) | ORAL | 0 refills | Status: AC

## 2021-09-08 MED ORDER — AMOXICILLIN-POT CLAVULANATE 875-125 MG PO TABS
1.0000 | ORAL_TABLET | Freq: Once | ORAL | Status: AC
  Administered 2021-09-08: 1 via ORAL
  Filled 2021-09-08: qty 1

## 2021-09-08 MED ORDER — LIDOCAINE HCL (PF) 1 % IJ SOLN
5.0000 mL | Freq: Once | INTRAMUSCULAR | Status: AC
  Administered 2021-09-08: 5 mL
  Filled 2021-09-08: qty 5

## 2021-09-08 NOTE — ED Triage Notes (Signed)
Dog bite PTA +swelling and 1 small laceration to upper lip and 1 puncture wound This was pt's dog and up to date on vaccines Bleeding controlled and ice applied

## 2021-09-08 NOTE — Discharge Instructions (Addendum)
You were evaluated in the Emergency Department and after careful evaluation, we did not find any emergent condition requiring admission or further testing in the hospital.  Your exam/testing today was overall reassuring.  Your lacerations were repaired with absorbable sutures and they do not need to be removed.  Important that you take the Augmentin antibiotic to prevent infection.  Please return to the Emergency Department if you experience any worsening of your condition such as redness to the wounds, increased pain to the area, or fever.  Thank you for allowing [place] to be a part of your care.

## 2021-09-08 NOTE — ED Triage Notes (Addendum)
Irrigated wounds, bleeding controlled

## 2021-09-08 NOTE — ED Provider Notes (Signed)
[facility]GENCY [facility] Emergency Department Provider Note MRN:  [id number]  Arrival date & time: 09/08/21     Chief Complaint   Animal Bite   History of Present Illness   Michele Mccarthy is a 51 y.o. year-old female with no pertinent past medical history presenting to the ED with chief complaint of animal bite.  Patient bent down to kiss her dog good night and the dog was startled and bit her in the face.  Dog is vaccinated.  Patient is up-to-date on tetanus.  Laceration to the upper lip.  Review of Systems  A thorough review of systems was obtained and all systems are negative except as noted in the HPI and PMH.   Patient's Health History   History reviewed. No pertinent past medical history.  Past Surgical History:  Procedure Laterality Date   APPENDECTOMY      No family history on file.  Social History   Socioeconomic History   Marital status: Married    Spouse name: Not on file   Number of children: Not on file   Years of education: Not on file   Highest education level: Not on file  Occupational History   Not on file  Tobacco Use   Smoking status: Never   Smokeless tobacco: Not on file  Substance and Sexual Activity   Alcohol use: Not on file   Drug use: Not on file   Sexual activity: Not on file  Other Topics Concern   Not on file  Social History Narrative   Not on file   Social Determinants of Health   Financial Resource Strain: Not on file  Food Insecurity: Not on file  Transportation Needs: Not on file  Physical Activity: Not on file  Stress: Not on file  Social Connections: Not on file  Intimate Partner Violence: Not on file     Physical Exam   Vitals:   09/08/21 0010  BP: 119/83  Pulse: 66  Resp: 18  Temp: 98.5 F (36.9 C)  SpO2: 100%    CONSTITUTIONAL: Well-appearing, NAD NEURO/PSYCH:  Alert and oriented x 3, no focal deficits EYES:  eyes equal and reactive ENT/NECK:  no LAD, no JVD CARDIO: Regular rate,  well-perfused, normal S1 and S2 PULM:  CTAB no wheezing or rhonchi GI/GU:  non-distended, non-tender MSK/SPINE:  No gross deformities, no edema SKIN: Puncture wound to the right and left cheek, linear laceration to the left upper lip   *Additional and/or pertinent findings included in MDM below  Diagnostic and Interventional Summary    EKG Interpretation  Date/Time:    Ventricular Rate:    PR Interval:    QRS Duration:   QT Interval:    QTC Calculation:   R Axis:     Text Interpretation:         Labs Reviewed - No data to display  No orders to display    Medications  lidocaine (PF) (XYLOCAINE) 1 % injection 5 mL (5 mLs Infiltration Given by Other 09/08/21 0238)  amoxicillin-clavulanate (AUGMENTIN) 875-125 MG per tablet 1 tablet (1 tablet Oral Given 09/08/21 0238)     Procedures  /  Critical Care .Marland KitchenLaceration Repair  Date/Time: 09/08/2021 3:28 AM  Performed by: Sabas Sous, MD Authorized by: Sabas Sous, MD   Consent:    Consent obtained:  Verbal   Consent given by:  Patient   Risks, benefits, and alternatives were discussed: yes     Risks discussed:  Infection, need for additional repair, nerve  damage, poor wound healing, poor cosmetic result, pain, tendon damage, vascular damage and retained foreign body   Alternatives discussed:  No treatment Universal protocol:    Procedure explained and questions answered to patient or proxy's satisfaction: yes     Immediately prior to procedure, a time out was called: yes     Patient identity confirmed:  Verbally with patient Anesthesia:    Anesthesia method:  Local infiltration   Local anesthetic:  Lidocaine 1% w/o epi Laceration details:    Location:  Lip   Lip location:  Upper exterior lip   Length (cm):  2   Depth (mm):  2 Pre-procedure details:    Preparation:  Patient was prepped and draped in usual sterile fashion Exploration:    Limited defect created (wound extended): no     Hemostasis achieved with:   Direct pressure   Wound exploration: wound explored through full range of motion and entire depth of wound visualized     Contaminated: no   Treatment:    Area cleansed with:  Saline Skin repair:    Repair method:  Sutures   Suture size:  5-0   Suture material:  Fast-absorbing gut   Suture technique:  Simple interrupted   Number of sutures:  4 Approximation:    Approximation:  Close   Vermilion border well-aligned: yes   Repair type:    Repair type:  Simple Post-procedure details:    Dressing:  Open (no dressing)   Procedure completion:  Tolerated well, no immediate complications Comments:     The laceration of the left upper lip was linear and repaired with 3 simple interrupted sutures.  The puncture wound to the right cheek had some persistent oozing of blood, upon further inspection seemed more like a small stellate laceration.  Single suture placed in this wound and hemostasis achieved.   ED Course and Medical Decision Making  Initial Impression and Ddx Dog bite, facial laceration, we discussed the pros and cons of suture repair, the increased risk of infection with repair, etc.  Cosmetically the puncture wounds look well, do not need intervention.  The lip laceration is a bit gaping and we will place some sutures.  Past medical/surgical history that increases complexity of ED encounter: None  Interpretation of Diagnostics Laboratory and/or imaging options to aid in the diagnosis/care of the patient were considered.  After careful history and physical examination, it was determined that there was no indication for diagnostics at this time. Patient Reassessment and Ultimate Disposition/Management     See procedural details above, appropriate for discharge.  Patient management required discussion with the following services or consulting groups:  None  Complexity of Problems Addressed Acute illness or injury that poses threat of life of bodily function  Additional Data  Reviewed and Analyzed Further history obtained from: Further history from spouse/family member  Additional Factors Impacting ED Encounter Risk Prescriptions and Minor Procedures  Elmer Sow. Pilar Plate, MD [facility] [email or web address]  Final Clinical Impressions(s) / ED Diagnoses     ICD-10-CM   1. Dog bite, initial encounter  W54.0XXA     2. Lip laceration, initial encounter  O96.295M       ED Discharge Orders          Ordered    amoxicillin-clavulanate (AUGMENTIN) 875-125 MG tablet  Every 12 hours        09/08/21 0327             Discharge Instructions Discussed with  and Provided to Patient:     Discharge Instructions      You were evaluated in the Emergency Department and after careful evaluation, we did not find any emergent condition requiring admission or further testing in the hospital.  Your exam/testing today was overall reassuring.  Your lacerations were repaired with absorbable sutures and they do not need to be removed.  Important that you take the Augmentin antibiotic to prevent infection.  Please return to the Emergency Department if you experience any worsening of your condition such as redness to the wounds, increased pain to the area, or fever.  Thank you for allowing [place] to be a part of your care.        Sabas Sous, MD 09/08/21 [phone]

## 2021-09-09 ENCOUNTER — Inpatient Hospital Stay
Admit: 2021-09-09 | Discharge: 2021-09-09 | Disposition: A | Discharge status: Home / Self Care | Payer: BLUE CROSS/BLUE SHIELD | Attending: Medical

## 2021-09-09 DIAGNOSIS — L539 Erythematous condition, unspecified: Secondary | ICD-10-CM | POA: Diagnosis not present

## 2021-09-09 DIAGNOSIS — S01551A Open bite of lip, initial encounter: Secondary | ICD-10-CM | POA: Diagnosis not present

## 2021-09-09 DIAGNOSIS — W540XXA Bitten by dog, initial encounter: Secondary | ICD-10-CM | POA: Diagnosis not present

## 2021-09-09 DIAGNOSIS — R220 Localized swelling, mass and lump, head: Secondary | ICD-10-CM | POA: Diagnosis not present

## 2021-09-09 DIAGNOSIS — L03211 Cellulitis of face: Secondary | ICD-10-CM | POA: Diagnosis not present

## 2021-09-09 NOTE — ED Provider Notes (Signed)
Chief Complaint   Patient presents with    Animal Bite    Wound Check       HPI  Patient is a 51 year old female who just arrived from [place] for a vacation at [employer] [place] who the day prior to leaving accidentally startled her dog who bit her in the face causing a puncture wound to the skin above the right upper lip and a laceration to the skin above the left upper lip.  She had sutures placed before she left [place].  She was started on Augmentin which she is taking twice daily.  Yesterday she noted that she had increased swelling of the upper lip and right cheek with erythema.  She has multitude of pictures that she shows me.  This persists and she has more edema at the angle of the right jaw or RO along the mandible on the right side.  She comes in just to have this checked to make sure that everything is okay.  The swelling has come down slightly.  No fevers no chills.  She is able to eat and drink.    Medical History:  Current Problem List:   There is no problem list on file for this patient.      Past Medical History:  No past medical history on file.    No past surgical history on file.    Social History     Socioeconomic History    Marital status: Married     Spouse name: Not on file    Number of children: Not on file    Years of education: Not on file    Highest education level: Not on file   Occupational History    Not on file   Tobacco Use    Smoking status: Never    Smokeless tobacco: Never   Substance and Sexual Activity    Alcohol use: Not Currently    Drug use: Not Currently    Sexual activity: Not on file   Other Topics Concern    Not on file   Social History Narrative    Not on file     Social Determinants of Health     Financial Resource Strain: Not on file   Food Insecurity: Not on file   Transportation Needs: Not on file   Physical Activity: Not on file   Stress: Not on file   Social Connections: Not on file   Intimate Partner Violence: Not on file   Housing Stability: Not  on file       Family History:  No family history on file.    Medications:  Patient medication list reviewed  Discharge Medication List as of 09/09/2021 12:41 PM        CONTINUE these medications which have NOT CHANGED    Details   amoxicillin-clavulanate (AUGMENTIN) 875-125 MG per tablet Take 1 tablet by mouth 2 times dailyHistorical Med      levothyroxine (SYNTHROID) 125 MCG tablet Take 1 tablet by mouth DailyHistorical Med             Anticoagulants / Antiplatelet medications:  This patient does not have an active medication from one of the medication groupers.    Allergies:    Patient has no known allergies.    Review of Systems  See history of present illness for relevant review of systems.    ED Triage Vitals [09/09/21 0942]   BP Temp Temp Source Pulse Respirations SpO2 Height Weight - Scale  131/87 97.9 F (36.6 C) Oral 72 14 98 % 5\' 9"  (1.753 m) 180 lb (81.6 kg)     Physical Exam  Patient looks well.  She is in no acute distress.  She does have a well-approximated small less than 1 cm vertical laceration above the right lip.  Does not cross the vermilion.  There is no evidence of surrounding erythema fluctuance or induration.  On the right there is what appears to be a major puncture above the right upper lip again not crossing the vermilion border and a smaller 1 lateral to this.  There is some mild erythema.  There is some induration over the initial puncture mark, the larger of the 2 and I can not express a little bit of pus from this.  I can not express little pus from the other as wel Compared to her pictures she took from yesterday the swelling and erythema decreased.  She can open her mouth wide.  She has a normal voice.  Neck is supple without tender adenopathy.  There is no periorbital involvement.    Medical Decision Making  Patient status post dog bite.  Was her own dog.  Dog shots are up-to-date.  She did have a small laceration as well as puncture wounds above the upper lip.  She does have some  small pus draining from the puncture wounds.  These are not amenable for incision and drainage.  She is on appropriate antibiotics with Augmentin.  She has continued to take this twice a day.  Moist warm compresses were recommended.  Return precautions particularly development of fever increasing swelling pain redness or any periorbital involvement she has to go to the nearest emergency department for re-evaluation.              ED Course:       Procedures    Vital Signs for this visit:  Vitals:    09/09/21 0942 09/09/21 1221   BP: 131/87    Pulse: 72    Resp: 14 18   Temp: 97.9 F (36.6 C)    TempSrc: Oral    SpO2: 98%    Weight: 180 lb (81.6 kg)    Height: 5\' 9"  (1.753 m)        Lab findings that I have personally reviewed and interpreted during this visit (only abnormal values will be noted, if no value noted then the result was normal range):  Labs Reviewed - No data to display    Recent radiology studies including this visit.  I have personally reviewed these studies and my personal interpretation, if available, is documented in the ED Course:  No orders to display       Medications given in the ED:  Medications - No data to display    Diagnosis:  1. Facial cellulitis    2. Open wound of face due to dog bite            Condition at disposition:  stable    DISPOSITION Decision To Discharge 09/09/2021 12:18:36 PM      Discharge prescriptions and/or changes if applicable:       Medication List        ASK your doctor about these medications      amoxicillin-clavulanate 875-125 MG per tablet  Commonly known as: AUGMENTIN     levothyroxine 125 MCG tablet  Commonly known as: SYNTHROID              Follow-up if applicable:  No follow-up  provider specified.    Please note that portions of this document were created using the M*Modal Fluency Direct dictation system.  Any inconsistencies or typographical errors may be the result of mis-transcription that persist in spite of proof-reading and should be addressed with the  document creator.        Nelida Gores, [place]  09/09/21 2148

## 2021-09-09 NOTE — ED Triage Notes (Signed)
Bit by dog in the face 2 days ago, seen in [place] where bite occurred. Started on augmentin.     Concerned because swelling on right side of face has been worsening.

## 2021-09-09 NOTE — ED Notes (Signed)
Report received from [facility] and this provider assumed primary care.     Langston Masker, EMT-P  09/09/21 1204

## 2021-09-09 NOTE — Discharge Instructions (Signed)
Continue taking the Augmentin until completion.  Use moist warm compresses to the affected areas on the right side of the face.  Follow-up at a walk-in care or urgent care or emergency department if this continues to worsen rather than improving.  This should improve over the next several days with significant decrease in redness and swelling.

## 2021-09-17 DIAGNOSIS — W540XXA Bitten by dog, initial encounter: Secondary | ICD-10-CM | POA: Diagnosis not present

## 2021-09-17 DIAGNOSIS — L089 Local infection of the skin and subcutaneous tissue, unspecified: Secondary | ICD-10-CM | POA: Diagnosis not present

## 2021-10-23 DIAGNOSIS — L237 Allergic contact dermatitis due to plants, except food: Secondary | ICD-10-CM | POA: Diagnosis not present

## 2021-10-23 DIAGNOSIS — H6691 Otitis media, unspecified, right ear: Secondary | ICD-10-CM | POA: Diagnosis not present

## 2021-12-10 DIAGNOSIS — E538 Deficiency of other specified B group vitamins: Secondary | ICD-10-CM | POA: Diagnosis not present

## 2021-12-10 DIAGNOSIS — Z13220 Encounter for screening for lipoid disorders: Secondary | ICD-10-CM | POA: Diagnosis not present

## 2021-12-10 DIAGNOSIS — Z0000 Encounter for general adult medical examination without abnormal findings: Secondary | ICD-10-CM | POA: Diagnosis not present

## 2021-12-10 DIAGNOSIS — D649 Anemia, unspecified: Secondary | ICD-10-CM | POA: Diagnosis not present

## 2022-03-16 ENCOUNTER — Ambulatory Visit: Payer: BC Managed Care – PPO | Admitting: Sports Medicine

## 2022-03-16 VITALS — BP 100/70 | Ht 69.0 in | Wt 185.0 lb

## 2022-03-16 DIAGNOSIS — M7541 Impingement syndrome of right shoulder: Secondary | ICD-10-CM | POA: Diagnosis not present

## 2022-03-16 DIAGNOSIS — M7501 Adhesive capsulitis of right shoulder: Secondary | ICD-10-CM | POA: Diagnosis not present

## 2022-03-16 MED ORDER — MELOXICAM 15 MG PO TABS: ORAL_TABLET | ORAL | 2 refills | Status: DC

## 2022-03-16 NOTE — Progress Notes (Signed)
   Subjective:    Patient ID: Michele Mccarthy, female    DOB: 04/11/1970, 52 y.o.   MRN: [id number]  HPI chief complaint: Right shoulder pain  Michele Mccarthy presents today complaining of approximately 2 months of lateral right shoulder pain.  She does not recall any injury or trauma to this area.  She endorses pain when abducting the arm with the elbow in a bent position.  She also has pain at night.  No prior injuries to the shoulder in the past.  No prior shoulder surgeries.  She has not tried any over-the-counter pain medication.  Past medical history reviewed Medications reviewed Allergies reviewed  Review of Systems As above    Objective:   Physical Exam  Well-developed, well-nourished.  No acute distress  Right shoulder: There is some slight limitation with passive external rotation on the right compared to the left.  Also some slight decrease in internal rotation on the right compared to left.  Full painless forward flexion.  Nearly full abduction but with a positive painful arc.  Positive empty can.  Positive Hawkins.  Rotator cuff strength is 5/5.  No tenderness to palpation.  Neurovascularly intact distally.      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  She will start physical therapy at [facility] and will follow-up with me again in 4 weeks.  We discussed the possibility of subacromial cortisone injection if oral anti-inflammatories are ineffective.  We also discussed imaging at follow-up if symptoms persist despite formal PT.  This note was dictated using Dragon naturally speaking software and may contain errors in syntax, spelling, or content which have not been identified prior to signing this note.

## 2022-03-22 DIAGNOSIS — E039 Hypothyroidism, unspecified: Secondary | ICD-10-CM | POA: Diagnosis not present

## 2022-03-23 DIAGNOSIS — M25519 Pain in unspecified shoulder: Secondary | ICD-10-CM | POA: Diagnosis not present

## 2022-03-25 DIAGNOSIS — M25519 Pain in unspecified shoulder: Secondary | ICD-10-CM | POA: Diagnosis not present

## 2022-03-30 DIAGNOSIS — M25519 Pain in unspecified shoulder: Secondary | ICD-10-CM | POA: Diagnosis not present

## 2022-03-31 DIAGNOSIS — M25519 Pain in unspecified shoulder: Secondary | ICD-10-CM | POA: Diagnosis not present

## 2022-04-05 DIAGNOSIS — M25519 Pain in unspecified shoulder: Secondary | ICD-10-CM | POA: Diagnosis not present

## 2022-04-07 DIAGNOSIS — M25519 Pain in unspecified shoulder: Secondary | ICD-10-CM | POA: Diagnosis not present

## 2022-04-12 DIAGNOSIS — M25519 Pain in unspecified shoulder: Secondary | ICD-10-CM | POA: Diagnosis not present

## 2022-04-14 DIAGNOSIS — M25519 Pain in unspecified shoulder: Secondary | ICD-10-CM | POA: Diagnosis not present

## 2022-04-15 ENCOUNTER — Ambulatory Visit: Payer: BC Managed Care – PPO | Admitting: Sports Medicine

## 2022-04-15 VITALS — BP 120/82 | Ht 69.0 in | Wt 179.0 lb

## 2022-04-15 DIAGNOSIS — M7501 Adhesive capsulitis of right shoulder: Secondary | ICD-10-CM | POA: Diagnosis not present

## 2022-04-15 DIAGNOSIS — M7541 Impingement syndrome of right shoulder: Secondary | ICD-10-CM

## 2022-04-15 NOTE — Progress Notes (Signed)
   Subjective:    Patient ID: Michele Mccarthy, female    DOB: November 30, 1970, 52 y.o.   MRN: [id number]  HPI  Michele Mccarthy presents today for follow-up on right shoulder pain secondary to adhesive capsulitis.  She has noticed about 20 to 30% improvement with physical therapy.  Although range of motion is still limited, it is slowly improving.  Her pain is also slowly improving.  She does still endorse lateral shoulder pain with certain movements.  Sleeping at night has become more comfortable.  She did not find any relief with meloxicam.    Review of Systems As above    Objective:   Physical Exam  Well-developed, well-nourished.  No acute distress  Right shoulder: Although there continues to be some limited passive external rotation on the right compared to the left, it is improved from her previous visit.  She still has limited internal rotation.  Good abduction with a positive painful arc.  Positive empty can.  Rotator cuff strength remains 5/5 but does reproduce pain with resisted supraspinatus and infraspinatus.  Neurovascularly intact distally.      Assessment & Plan:   Right shoulder pain secondary to rotator cuff impingement with early adhesive capsulitis  I believe the adhesive capsulitis is her main pain generator.  It sounds like her pain is slowly resolving and her range of motion is slowly improving.  Therefore, we will hold on a cortisone injection today.  I did explain the importance of continuing with her physical therapy.  If cost becomes prohibitive then I think she would do well with a home exercise program.  She may stick with simple over-the-counter NSAIDs or Tylenol as needed for pain.  As long as she continues to improve, she may follow-up as needed.  This note was dictated using Dragon naturally speaking software and may contain errors in syntax, spelling, or content which have not been identified prior to signing this note.

## 2022-04-19 DIAGNOSIS — M25519 Pain in unspecified shoulder: Secondary | ICD-10-CM | POA: Diagnosis not present

## 2022-04-20 DIAGNOSIS — E039 Hypothyroidism, unspecified: Secondary | ICD-10-CM | POA: Diagnosis not present

## 2022-04-21 DIAGNOSIS — Z01419 Encounter for gynecological examination (general) (routine) without abnormal findings: Secondary | ICD-10-CM | POA: Diagnosis not present

## 2022-04-21 DIAGNOSIS — E8941 Symptomatic postprocedural ovarian failure: Secondary | ICD-10-CM | POA: Diagnosis not present

## 2022-04-21 DIAGNOSIS — Z1231 Encounter for screening mammogram for malignant neoplasm of breast: Secondary | ICD-10-CM | POA: Diagnosis not present

## 2022-04-21 DIAGNOSIS — Z6827 Body mass index (BMI) 27.0-27.9, adult: Secondary | ICD-10-CM | POA: Diagnosis not present

## 2022-04-24 DIAGNOSIS — M25519 Pain in unspecified shoulder: Secondary | ICD-10-CM | POA: Diagnosis not present

## 2022-04-28 DIAGNOSIS — M25519 Pain in unspecified shoulder: Secondary | ICD-10-CM | POA: Diagnosis not present

## 2022-05-03 DIAGNOSIS — M25519 Pain in unspecified shoulder: Secondary | ICD-10-CM | POA: Diagnosis not present

## 2022-05-05 DIAGNOSIS — M25519 Pain in unspecified shoulder: Secondary | ICD-10-CM | POA: Diagnosis not present

## 2022-05-10 DIAGNOSIS — M25519 Pain in unspecified shoulder: Secondary | ICD-10-CM | POA: Diagnosis not present

## 2022-05-12 DIAGNOSIS — M25519 Pain in unspecified shoulder: Secondary | ICD-10-CM | POA: Diagnosis not present

## 2022-05-13 IMAGING — US US THYROID
1 series · 13 of 25 positions shown · non-contrast
Comparison: None.

CLINICAL DATA: 49-year-old female with a history of thyroid nodule

EXAM:
THYROID ULTRASOUND
TECHNIQUE: Ultrasound examination of the thyroid gland and adjacent soft
tissues was performed.

[Series 1: us thyroid · 0.05mm/px · 13 of 41 slices shown]
[im 1/41]
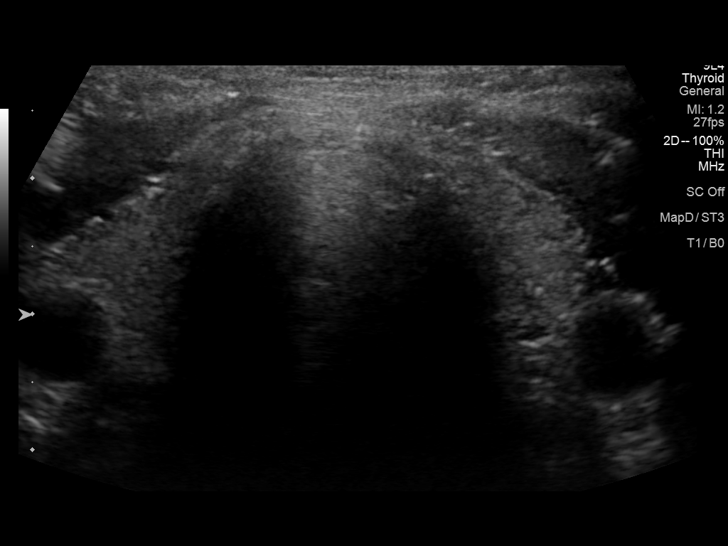
[im 4/41]
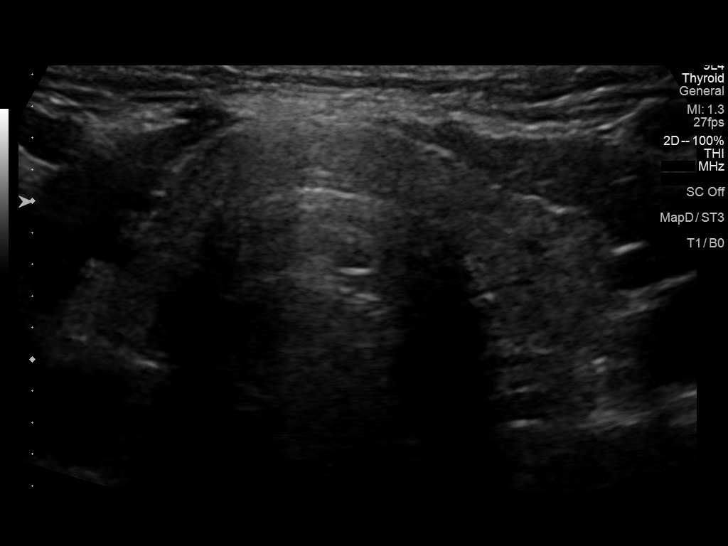
[im 7/41]
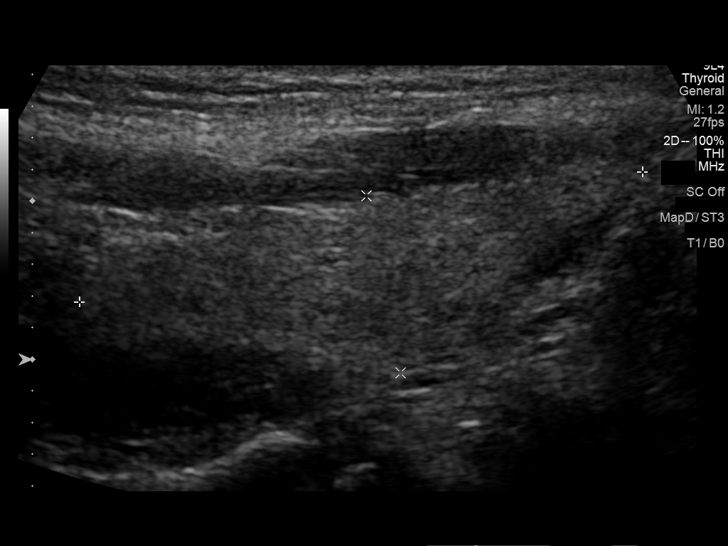
[im 11/41]
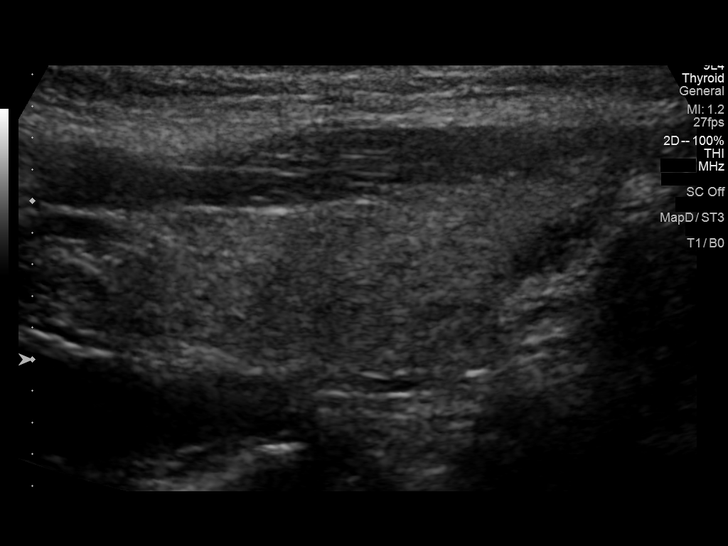
[im 14/41]
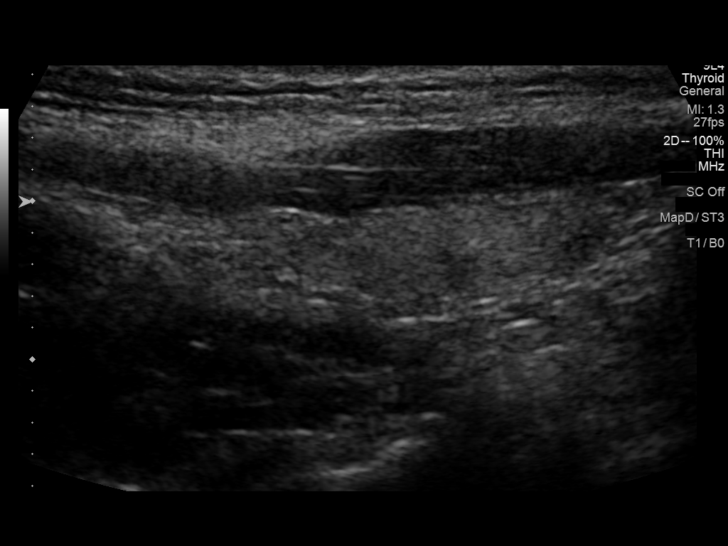
[im 17/41]
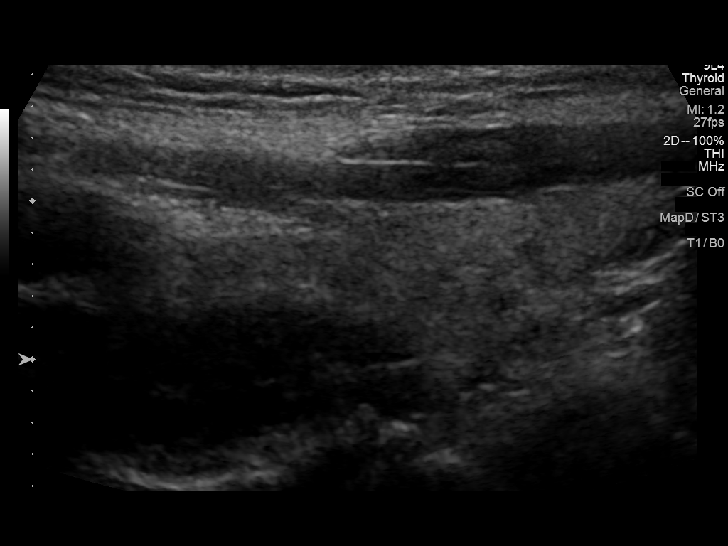
[im 21/41]
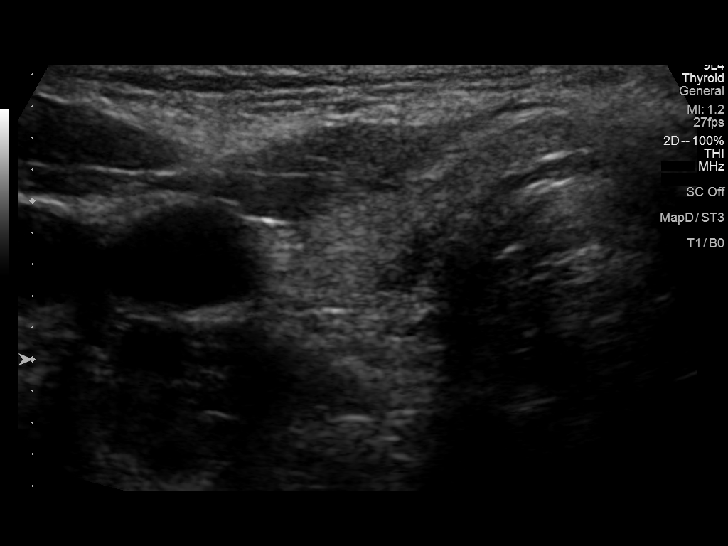
[im 24/41]
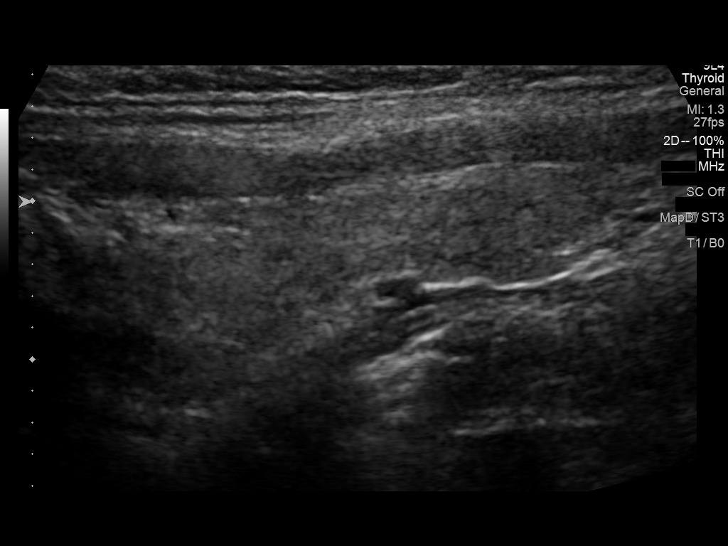
[im 27/41]
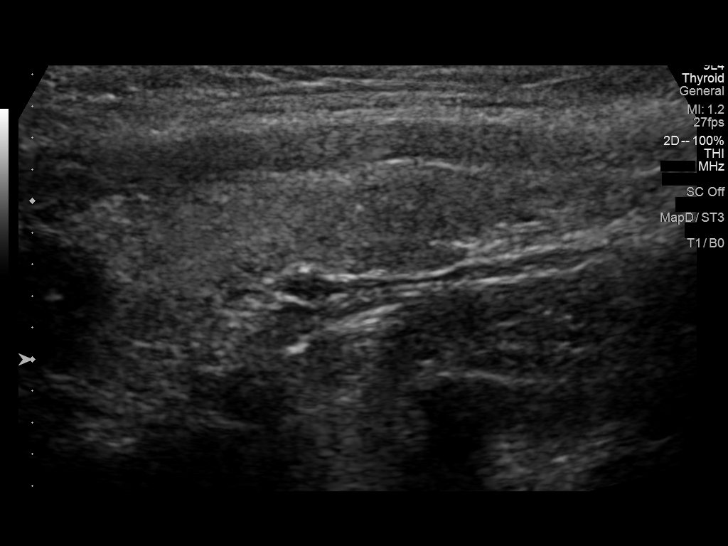
[im 31/41]
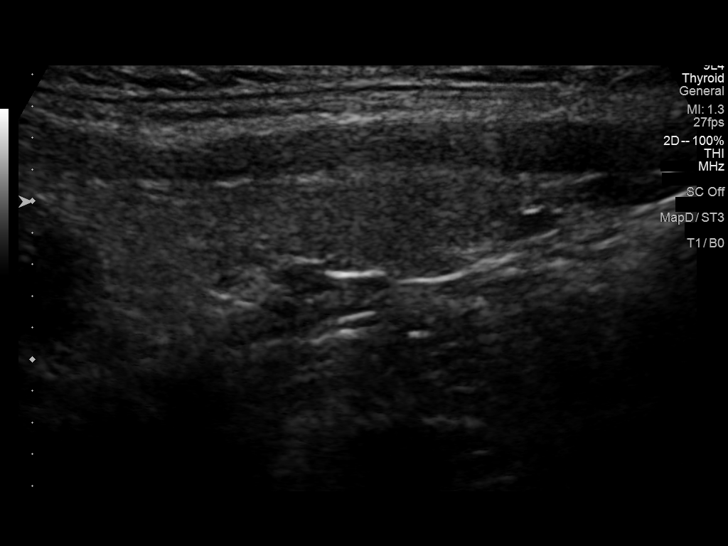
[im 34/41]
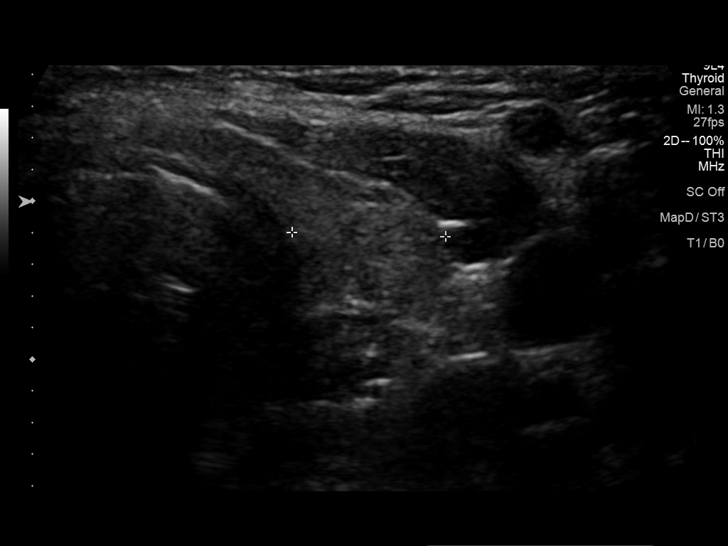
[im 37/41]
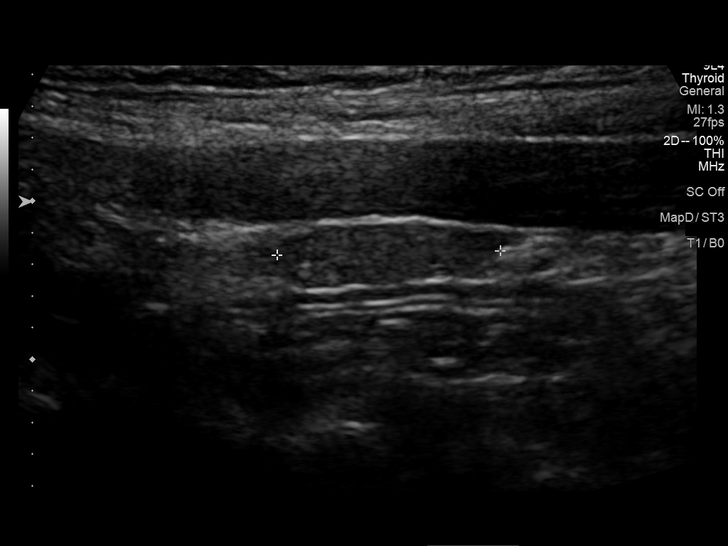
[im 41/41]
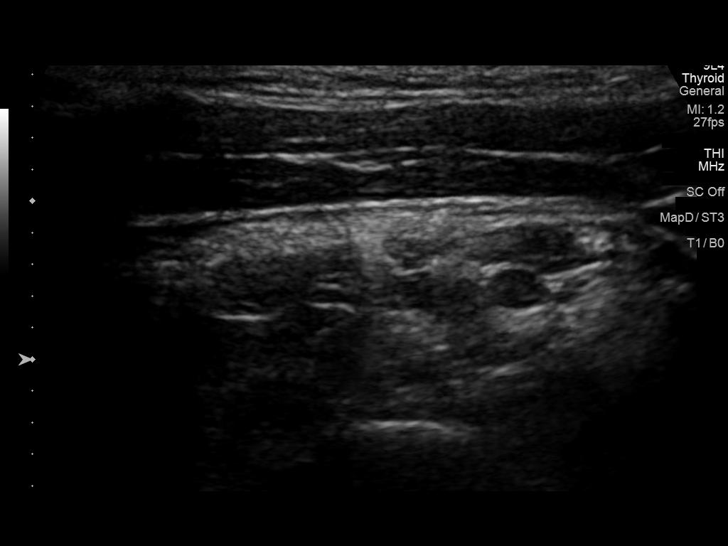

[13 of 25 positions shown; findings below may reference images not displayed]

FINDINGS: Parenchymal Echotexture: Mildly heterogenous

Isthmus: 0.5 cm

Right lobe: 3.7 cm x 1.1 cm x 0.9 cm

Left lobe: 3.4 cm x 1.1 cm x 1.0 cm

_________________________________________________________

Estimated total number of nodules >/= 1 cm: 1

Number of spongiform nodules >/=  2 cm not described below (TR1): 0

Number of mixed cystic and solid nodules >/= 1.5 cm not described
below (TR2): 0

_________________________________________________________

Nodule # 1:

Location: Left; Inferior

Maximum size: 1.2 cm; Other 2 dimensions: 0.8 cm x 0.6 cm

Composition: cannot determine (2)

Echogenicity: isoechoic (1)

Shape: not taller-than-wide (0)

Margins: smooth (0)

Echogenic foci: none (0)

ACR TI-RADS total points: 3.

ACR TI-RADS risk category: TR3 (3 points).

ACR TI-RADS recommendations:

Nodule does not meet criteria for surveillance or biopsy

_________________________________________________________

No adenopathy
IMPRESSION: Mildly heterogeneous thyroid tissue may indicate medical thyroid
disease.

No thyroid nodule meets criteria for biopsy or surveillance, as
designated by the newly established ACR TI-RADS criteria.

Recommendations follow those established by the new ACR TI-RADS
criteria ([HOSPITAL] 6559;[DATE]).

## 2022-05-21 DIAGNOSIS — M25519 Pain in unspecified shoulder: Secondary | ICD-10-CM | POA: Diagnosis not present

## 2022-05-26 DIAGNOSIS — M25519 Pain in unspecified shoulder: Secondary | ICD-10-CM | POA: Diagnosis not present

## 2022-06-02 DIAGNOSIS — M25519 Pain in unspecified shoulder: Secondary | ICD-10-CM | POA: Diagnosis not present

## 2022-06-09 DIAGNOSIS — M25519 Pain in unspecified shoulder: Secondary | ICD-10-CM | POA: Diagnosis not present

## 2022-06-17 DIAGNOSIS — K5730 Diverticulosis of large intestine without perforation or abscess without bleeding: Secondary | ICD-10-CM | POA: Diagnosis not present

## 2022-06-17 DIAGNOSIS — Z800 Family history of malignant neoplasm of digestive organs: Secondary | ICD-10-CM | POA: Diagnosis not present

## 2022-06-17 DIAGNOSIS — Z86010 Personal history of colonic polyps: Secondary | ICD-10-CM | POA: Diagnosis not present

## 2022-07-21 DIAGNOSIS — L820 Inflamed seborrheic keratosis: Secondary | ICD-10-CM | POA: Diagnosis not present

## 2022-07-21 DIAGNOSIS — L821 Other seborrheic keratosis: Secondary | ICD-10-CM | POA: Diagnosis not present

## 2022-07-21 DIAGNOSIS — L814 Other melanin hyperpigmentation: Secondary | ICD-10-CM | POA: Diagnosis not present

## 2022-07-21 DIAGNOSIS — D225 Melanocytic nevi of trunk: Secondary | ICD-10-CM | POA: Diagnosis not present

## 2022-07-21 DIAGNOSIS — L578 Other skin changes due to chronic exposure to nonionizing radiation: Secondary | ICD-10-CM | POA: Diagnosis not present

## 2022-08-06 DIAGNOSIS — M25519 Pain in unspecified shoulder: Secondary | ICD-10-CM | POA: Diagnosis not present

## 2022-08-18 DIAGNOSIS — M25519 Pain in unspecified shoulder: Secondary | ICD-10-CM | POA: Diagnosis not present

## 2022-09-02 DIAGNOSIS — M25519 Pain in unspecified shoulder: Secondary | ICD-10-CM | POA: Diagnosis not present

## 2022-12-16 DIAGNOSIS — D649 Anemia, unspecified: Secondary | ICD-10-CM | POA: Diagnosis not present

## 2022-12-16 DIAGNOSIS — Z13220 Encounter for screening for lipoid disorders: Secondary | ICD-10-CM | POA: Diagnosis not present

## 2022-12-16 DIAGNOSIS — Z0000 Encounter for general adult medical examination without abnormal findings: Secondary | ICD-10-CM | POA: Diagnosis not present

## 2022-12-16 DIAGNOSIS — E538 Deficiency of other specified B group vitamins: Secondary | ICD-10-CM | POA: Diagnosis not present

## 2022-12-16 DIAGNOSIS — Z79899 Other long term (current) drug therapy: Secondary | ICD-10-CM | POA: Diagnosis not present

## 2022-12-16 DIAGNOSIS — E039 Hypothyroidism, unspecified: Secondary | ICD-10-CM | POA: Diagnosis not present

## 2022-12-17 ENCOUNTER — Other Ambulatory Visit: Payer: Self-pay | Admitting: Family Medicine

## 2022-12-17 DIAGNOSIS — Z8249 Family history of ischemic heart disease and other diseases of the circulatory system: Secondary | ICD-10-CM

## 2022-12-28 ENCOUNTER — Ambulatory Visit
Admission: RE | Admit: 2022-12-28 | Discharge: 2022-12-28 | Disposition: A | Discharge status: Home / Self Care | Payer: No Typology Code available for payment source | Source: Ambulatory Visit | Attending: Family Medicine | Admitting: Family Medicine

## 2022-12-28 DIAGNOSIS — Z8249 Family history of ischemic heart disease and other diseases of the circulatory system: Secondary | ICD-10-CM

## 2023-02-08 DIAGNOSIS — M79662 Pain in left lower leg: Secondary | ICD-10-CM | POA: Diagnosis not present

## 2023-02-08 DIAGNOSIS — M79661 Pain in right lower leg: Secondary | ICD-10-CM | POA: Diagnosis not present

## 2023-02-08 DIAGNOSIS — I83893 Varicose veins of bilateral lower extremities with other complications: Secondary | ICD-10-CM | POA: Diagnosis not present

## 2023-02-08 DIAGNOSIS — M79604 Pain in right leg: Secondary | ICD-10-CM | POA: Diagnosis not present

## 2023-02-09 DIAGNOSIS — I83893 Varicose veins of bilateral lower extremities with other complications: Secondary | ICD-10-CM | POA: Diagnosis not present

## 2023-02-28 DIAGNOSIS — N950 Postmenopausal bleeding: Secondary | ICD-10-CM | POA: Diagnosis not present

## 2023-03-01 ENCOUNTER — Other Ambulatory Visit: Payer: Self-pay | Admitting: Family Medicine

## 2023-03-01 DIAGNOSIS — K7689 Other specified diseases of liver: Secondary | ICD-10-CM

## 2023-03-03 ENCOUNTER — Ambulatory Visit
Admission: RE | Admit: 2023-03-03 | Discharge: 2023-03-03 | Disposition: A | Discharge status: Home / Self Care | Payer: BC Managed Care – PPO | Source: Ambulatory Visit | Attending: Family Medicine | Admitting: Family Medicine

## 2023-03-03 DIAGNOSIS — K769 Liver disease, unspecified: Secondary | ICD-10-CM | POA: Diagnosis not present

## 2023-03-03 DIAGNOSIS — K7689 Other specified diseases of liver: Secondary | ICD-10-CM

## 2023-03-07 DIAGNOSIS — M79671 Pain in right foot: Secondary | ICD-10-CM | POA: Diagnosis not present

## 2023-03-07 DIAGNOSIS — M79673 Pain in unspecified foot: Secondary | ICD-10-CM | POA: Diagnosis not present

## 2023-03-17 ENCOUNTER — Ambulatory Visit (INDEPENDENT_AMBULATORY_CARE_PROVIDER_SITE_OTHER): Payer: BC Managed Care – PPO

## 2023-03-17 ENCOUNTER — Encounter: Payer: Self-pay | Admitting: Podiatry

## 2023-03-17 ENCOUNTER — Ambulatory Visit: Payer: BC Managed Care – PPO | Admitting: Podiatry

## 2023-03-17 DIAGNOSIS — M21619 Bunion of unspecified foot: Secondary | ICD-10-CM | POA: Diagnosis not present

## 2023-03-17 DIAGNOSIS — M778 Other enthesopathies, not elsewhere classified: Secondary | ICD-10-CM | POA: Diagnosis not present

## 2023-03-17 DIAGNOSIS — M722 Plantar fascial fibromatosis: Secondary | ICD-10-CM | POA: Diagnosis not present

## 2023-03-17 DIAGNOSIS — L600 Ingrowing nail: Secondary | ICD-10-CM

## 2023-03-17 DIAGNOSIS — M19071 Primary osteoarthritis, right ankle and foot: Secondary | ICD-10-CM

## 2023-03-17 DIAGNOSIS — M19072 Primary osteoarthritis, left ankle and foot: Secondary | ICD-10-CM

## 2023-03-17 MED ORDER — MELOXICAM 15 MG PO TABS: 15.0000 mg | ORAL_TABLET | Freq: Every day | ORAL | 0 refills | Status: DC | PRN

## 2023-03-17 NOTE — Patient Instructions (Signed)
 The insert we talked about is called SUPERFEET  --  Plantar Fasciitis (Heel Spur Syndrome) with Rehab The plantar fascia is a fibrous, ligament-like, soft-tissue structure that spans the bottom of the foot. Plantar fasciitis is a condition that causes pain in the foot due to inflammation of the tissue. SYMPTOMS  Pain and tenderness on the underneath side of the foot. Pain that worsens with standing or walking. CAUSES  Plantar fasciitis is caused by irritation and injury to the plantar fascia on the underneath side of the foot. Common mechanisms of injury include: Direct trauma to bottom of the foot. Damage to a small nerve that runs under the foot where the main fascia attaches to the heel bone. Stress placed on the plantar fascia due to bone spurs. RISK INCREASES WITH:  Activities that place stress on the plantar fascia (running, jumping, pivoting, or cutting). Poor strength and flexibility. Improperly fitted shoes. Tight calf muscles. Flat feet. Failure to warm-up properly before activity. Obesity. PREVENTION Warm up and stretch properly before activity. Allow for adequate recovery between workouts. Maintain physical fitness: Strength, flexibility, and endurance. Cardiovascular fitness. Maintain a health body weight. Avoid stress on the plantar fascia. Wear properly fitted shoes, including arch supports for individuals who have flat feet.  PROGNOSIS  If treated properly, then the symptoms of plantar fasciitis usually resolve without surgery. However, occasionally surgery is necessary.  RELATED COMPLICATIONS  Recurrent symptoms that may result in a chronic condition. Problems of the lower back that are caused by compensating for the injury, such as limping. Pain or weakness of the foot during push-off following surgery. Chronic inflammation, scarring, and partial or complete fascia tear, occurring more often from repeated injections.  TREATMENT  Treatment initially  involves the use of ice and medication to help reduce pain and inflammation. The use of strengthening and stretching exercises may help reduce pain with activity, especially stretches of the Achilles tendon. These exercises may be performed at home or with a therapist. Your caregiver may recommend that you use heel cups of arch supports to help reduce stress on the plantar fascia. Occasionally, corticosteroid injections are given to reduce inflammation. If symptoms persist for greater than 6 months despite non-surgical (conservative), then surgery may be recommended.   MEDICATION  If pain medication is necessary, then nonsteroidal anti-inflammatory medications, such as aspirin and ibuprofen, or other minor pain relievers, such as acetaminophen, are often recommended. Do not take pain medication within 7 days before surgery. Prescription pain relievers may be given if deemed necessary by your caregiver. Use only as directed and only as much as you need. Corticosteroid injections may be given by your caregiver. These injections should be reserved for the most serious cases, because they may only be given a certain number of times.  HEAT AND COLD Cold treatment (icing) relieves pain and reduces inflammation. Cold treatment should be applied for 10 to 15 minutes every 2 to 3 hours for inflammation and pain and immediately after any activity that aggravates your symptoms. Use ice packs or massage the area with a piece of ice (ice massage). Heat treatment may be used prior to performing the stretching and strengthening activities prescribed by your caregiver, physical therapist, or athletic trainer. Use a heat pack or soak the injury in warm water.  SEEK IMMEDIATE MEDICAL CARE IF: Treatment seems to offer no benefit, or the condition worsens. Any medications produce adverse side effects.  EXERCISES- RANGE OF MOTION (ROM) AND STRETCHING EXERCISES - Plantar Fasciitis (Heel Spur Syndrome) These exercises  may help you when beginning to rehabilitate your injury. Your symptoms may resolve with or without further involvement from your physician, physical therapist or athletic trainer. While completing these exercises, remember:  Restoring tissue flexibility helps normal motion to return to the joints. This allows healthier, less painful movement and activity. An effective stretch should be held for at least 30 seconds. A stretch should never be painful. You should only feel a gentle lengthening or release in the stretched tissue.  RANGE OF MOTION - Toe Extension, Flexion Sit with your right / left leg crossed over your opposite knee. Grasp your toes and gently pull them back toward the top of your foot. You should feel a stretch on the bottom of your toes and/or foot. Hold this stretch for 10 seconds. Now, gently pull your toes toward the bottom of your foot. You should feel a stretch on the top of your toes and or foot. Hold this stretch for 10 seconds. Repeat  times. Complete this stretch 3 times per day.   RANGE OF MOTION - Ankle Dorsiflexion, Active Assisted Remove shoes and sit on a chair that is preferably not on a carpeted surface. Place right / left foot under knee. Extend your opposite leg for support. Keeping your heel down, slide your right / left foot back toward the chair until you feel a stretch at your ankle or calf. If you do not feel a stretch, slide your bottom forward to the edge of the chair, while still keeping your heel down. Hold this stretch for 10 seconds. Repeat 3 times. Complete this stretch 2 times per day.   STRETCH  Gastroc, Standing Place hands on wall. Extend right / left leg, keeping the front knee somewhat bent. Slightly point your toes inward on your back foot. Keeping your right / left heel on the floor and your knee straight, shift your weight toward the wall, not allowing your back to arch. You should feel a gentle stretch in the right / left calf. Hold this  position for 10 seconds. Repeat 3 times. Complete this stretch 2 times per day.  STRETCH  Soleus, Standing Place hands on wall. Extend right / left leg, keeping the other knee somewhat bent. Slightly point your toes inward on your back foot. Keep your right / left heel on the floor, bend your back knee, and slightly shift your weight over the back leg so that you feel a gentle stretch deep in your back calf. Hold this position for 10 seconds. Repeat 3 times. Complete this stretch 2 times per day.  STRETCH  Gastrocsoleus, Standing  Note: This exercise can place a lot of stress on your foot and ankle. Please complete this exercise only if specifically instructed by your caregiver.  Place the ball of your right / left foot on a step, keeping your other foot firmly on the same step. Hold on to the wall or a rail for balance. Slowly lift your other foot, allowing your body weight to press your heel down over the edge of the step. You should feel a stretch in your right / left calf. Hold this position for 10 seconds. Repeat this exercise with a slight bend in your right / left knee. Repeat 3 times. Complete this stretch 2 times per day.   STRENGTHENING EXERCISES - Plantar Fasciitis (Heel Spur Syndrome)  These exercises may help you when beginning to rehabilitate your injury. They may resolve your symptoms with or without further involvement from your physician, physical therapist or  Event organiser. While completing these exercises, remember:  Muscles can gain both the endurance and the strength needed for everyday activities through controlled exercises. Complete these exercises as instructed by your physician, physical therapist or athletic trainer. Progress the resistance and repetitions only as guided.  STRENGTH - Towel Curls Sit in a chair positioned on a non-carpeted surface. Place your foot on a towel, keeping your heel on the floor. Pull the towel toward your heel by only curling your  toes. Keep your heel on the floor. Repeat 3 times. Complete this exercise 2 times per day.  STRENGTH - Ankle Inversion Secure one end of a rubber exercise band/tubing to a fixed object (table, pole). Loop the other end around your foot just before your toes. Place your fists between your knees. This will focus your strengthening at your ankle. Slowly, pull your big toe up and in, making sure the band/tubing is positioned to resist the entire motion. Hold this position for 10 seconds. Have your muscles resist the band/tubing as it slowly pulls your foot back to the starting position. Repeat 3 times. Complete this exercises 2 times per day.  Document Released: 01/18/2005 Document Revised: 04/12/2011 Document Reviewed: 05/02/2008 [facility] Patient Information 2014 [place], [place].

## 2023-03-17 NOTE — Progress Notes (Signed)
Subjective:  Patient ID: Michele Mccarthy, female    DOB: [birth year],  MRN: [id number]  Chief Complaint  Patient presents with   Foot Pain    RM#13 Foot pain since 12/24 no injuries hurting on toe area also left foot big toe ingrown.Very active and not sure if it is just over use.    Discussed the use of AI scribe software for clinical note transcription with the patient, who gave verbal consent to proceed.  History of Present Illness           Michele Mccarthy is a 53 year old female who presents with bilateral foot pain.  She has been experiencing bilateral foot pain since December, which she attributes to overuse and possibly wearing the wrong shoes. The pain is primarily located on the tops and sides of her feet and is exacerbated by wearing any type of shoes. She describes difficulty walking due to the pain. She has a history of arthritis and pronation, which she believes may be contributing to her foot issues. She has previously experienced plantar fasciitis and bursitis, which she attributes to ill-fitting shoes and overuse. She has not received any formal treatment for her current foot pain but has used over-the-counter topical treatments for relief.  She reports a problem with her toe, suspecting an ingrown toenail or possibly a fungal infection, although it does not feel like a typical ingrown toenail. The toe issue is not constant but is aggravated at times. She has not noticed any significant changes in the appearance of the toenail.  She has tried various shoe inserts and new shoes, including Atrex and Brooks, to alleviate her symptoms, but continues to experience discomfort. She has also used over-the-counter insoles but finds them inadequate for her needs as it causes rubbing to the tops of the shoes.    Objective:    Physical Exam         General: AAO x3, NAD  Dermatological: Mild incurvation of the lateral hallux toenail without any edema, erythema, drainage or  pus signs of infection.  The majority of tenderness that she has along the proximal nail fold at times but currently no pain.  No signs of infection.  Vascular: Dorsalis Pedis artery and Posterior Tibial artery pedal pulses are 2/4 bilateral with immedate capillary fill time. There is no pain with calf compression, swelling, warmth, erythema.   Neruologic: Grossly intact via light touch bilateral.  Musculoskeletal: Bunion is present.  Tenderness palpation first BJ bilaterally there is also tenderness mostly along the Lisfranc joint dorsally.  There are some slight edema with no erythema or warmth.  Flexor, extensor tendons appear to be intact.  MMT pressure is 5.  Gait: Unassisted, Nonantalgic.    No images are attached to the encounter.    Results          Assessment:   1. Plantar fasciitis   2. Arthritis of both feet   3. Bunion   4. Ingrown toenail      Plan:  Patient was evaluated and treated and all questions answered.  Assessment and Plan           Foot Pain Chronic bilateral foot pain, worse on the tops and sides of the feet. Likely secondary to arthritis and pronation. X-rays confirm arthritis in the Lisfranc joint and metatarsophalangeal joints. No significant bone spurs noted. -Start Meloxicam for inflammation. -Consider custom orthotics for better foot support and to control pronation.  We discussed Superfeet inserts. -Recommend appropriate footwear and lacing techniques to  reduce pressure on painful areas. -Provide worksheet with exercises to strengthen foot and leg muscles.  Ingrown Toenail Mild discomfort in the toe, possibly due to an ingrown toenail. No signs of fungal infection at this time. -Advise on Epsom salt soaks  -Monitor for changes and consider in-office procedure if symptoms persist or worsen. -Likely coming from bunion workup of the second toe but also biomechanical causing discomfort in her shoes  Plantar Fasciitis History of plantar  fasciitis -Continue with recommended exercises and appropriate footwear to prevent recurrence.    Return if symptoms worsen or fail to improve.   Vivi Barrack DPM

## 2023-03-21 DIAGNOSIS — N950 Postmenopausal bleeding: Secondary | ICD-10-CM | POA: Diagnosis not present

## 2023-04-05 DIAGNOSIS — N950 Postmenopausal bleeding: Secondary | ICD-10-CM | POA: Diagnosis not present

## 2023-04-05 DIAGNOSIS — N840 Polyp of corpus uteri: Secondary | ICD-10-CM | POA: Diagnosis not present

## 2023-06-22 DIAGNOSIS — Z6827 Body mass index (BMI) 27.0-27.9, adult: Secondary | ICD-10-CM | POA: Diagnosis not present

## 2023-06-22 DIAGNOSIS — Z01419 Encounter for gynecological examination (general) (routine) without abnormal findings: Secondary | ICD-10-CM | POA: Diagnosis not present

## 2023-06-22 DIAGNOSIS — Z124 Encounter for screening for malignant neoplasm of cervix: Secondary | ICD-10-CM | POA: Diagnosis not present

## 2023-06-22 DIAGNOSIS — Z1231 Encounter for screening mammogram for malignant neoplasm of breast: Secondary | ICD-10-CM | POA: Diagnosis not present

## 2023-06-22 DIAGNOSIS — Z1151 Encounter for screening for human papillomavirus (HPV): Secondary | ICD-10-CM | POA: Diagnosis not present

## 2023-06-22 DIAGNOSIS — N951 Menopausal and female climacteric states: Secondary | ICD-10-CM | POA: Diagnosis not present

## 2023-06-23 ENCOUNTER — Other Ambulatory Visit: Payer: Self-pay | Admitting: Obstetrics and Gynecology

## 2023-06-23 DIAGNOSIS — Z1329 Encounter for screening for other suspected endocrine disorder: Secondary | ICD-10-CM

## 2023-06-28 ENCOUNTER — Ambulatory Visit
Admission: RE | Admit: 2023-06-28 | Discharge: 2023-06-28 | Disposition: A | Discharge status: Home / Self Care | Source: Ambulatory Visit | Attending: Obstetrics and Gynecology | Admitting: Obstetrics and Gynecology

## 2023-06-28 DIAGNOSIS — E042 Nontoxic multinodular goiter: Secondary | ICD-10-CM | POA: Diagnosis not present

## 2023-06-28 DIAGNOSIS — Z1329 Encounter for screening for other suspected endocrine disorder: Secondary | ICD-10-CM

## 2023-07-07 DIAGNOSIS — J0110 Acute frontal sinusitis, unspecified: Secondary | ICD-10-CM | POA: Diagnosis not present

## 2023-07-19 DIAGNOSIS — L814 Other melanin hyperpigmentation: Secondary | ICD-10-CM | POA: Diagnosis not present

## 2023-07-19 DIAGNOSIS — D225 Melanocytic nevi of trunk: Secondary | ICD-10-CM | POA: Diagnosis not present

## 2023-07-19 DIAGNOSIS — L821 Other seborrheic keratosis: Secondary | ICD-10-CM | POA: Diagnosis not present

## 2023-07-19 DIAGNOSIS — L578 Other skin changes due to chronic exposure to nonionizing radiation: Secondary | ICD-10-CM | POA: Diagnosis not present

## 2023-09-27 NOTE — Progress Notes (Signed)
 Office Visit Note  Patient: Michele Mccarthy             Date of Birth: [birth year]           MRN: [id number]             PCP: Verena Mems, MD Referring: Verena Mems, MD Visit Date: 10/11/2023 Occupation: @GUAROCC @  Subjective:  Positive ANA, pain in feet  History of Present Illness: Michele Mccarthy is a 53 y.o. female seen for the evaluation of positive ANA.  According the patient and December 2024 she went to [place] on a trip.  She states she had to walk a lot and started having pain and discomfort in her feet which she describes on the dorsum of her feet.  She states when she can return she went to see Dr. Verena.  Who obtained some lab work and her ANA was positive.  She also found some warmth on palpation of her ankles and referred her to a podiatrist.  She was evaluated by Dr. Gershon.  Who obtained x-rays of her feet and diagnosed her with osteoarthritis.  She also had a spot in her right heel and was diagnosed with right plantar fasciitis.  No treatment was given.  He gives some exercises for her feet. She states none of the other joints are painful.  She had frozen shoulder earlier this year which responded to physical therapy.  She was also given meloxicam  which was not helpful.  None of the other joints are painful.  There is no history of joint swelling.  There is no history of oral ulcers, nasal ulcers, sicca symptoms, malar rash, photosensitivity or lymphadenopathy.  She gives history of Raynaud's phenomenon since she was in her 30s.  The symptoms are mostly prominent during the winter months.  She has never had digital ulcers.  She had a CAC score in January 2024 which was unremarkable except she was found to have a small pulmonary nodules with largest measuring up to 4 mm.  She gives history of occasional cough.  She is non-smoker.  She was also found to have a possible hepatic cyst for that reason she had ultrasound of her abdomen which showed 3.4 cm echogenic mass in  the right hepatic lobe.  A MRI was advised.  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  She enjoys traveling, walking, biking and yoga.  She is married, gravida 3, para 3.  There is no history of preeclampsia or DVTs.  She drinks alcohol only occasionally.  She has always been a non-smoker.  There is no family history of autoimmune disease.   Activities of Daily Living:  Patient reports morning stiffness for a few minutes.   Patient Reports nocturnal pain.  Difficulty dressing/grooming: Denies Difficulty climbing stairs: Denies Difficulty getting out of chair: Denies Difficulty using hands for taps, buttons, cutlery, and/or writing: Denies  Review of Systems  Constitutional:  Negative for fatigue.  HENT:  Negative for mouth sores and mouth dryness.   Eyes:  Negative for dryness.  Respiratory:  Negative for shortness of breath.   Cardiovascular:  Positive for swelling in legs/feet. Negative for chest pain and palpitations.  Gastrointestinal:  Negative for blood in stool, constipation and diarrhea.  Endocrine: Negative for increased urination.  Genitourinary:  Negative for involuntary urination.  Musculoskeletal:  Positive for joint pain, joint pain and morning stiffness. Negative for gait problem, joint swelling, myalgias, muscle weakness, muscle tenderness and myalgias.  Skin:  Positive for color change. Negative  for rash, hair loss and sensitivity to sunlight.  Allergic/Immunologic: Positive for susceptible to infections.  Neurological:  Positive for numbness. Negative for dizziness and headaches.  Hematological:  Negative for swollen glands.  Psychiatric/Behavioral:  Negative for depressed mood and sleep disturbance. The patient is not nervous/anxious.     PMFS History:  Patient Active Problem List   Diagnosis Date Noted   Acquired hypothyroidism 10/11/2023   Venous insufficiency (chronic) (peripheral) 10/11/2023   Iron deficiency 10/11/2023   History of colon  polyps 10/11/2023    Past Medical History:  Diagnosis Date   Liver cyst    per patient    Family History  Problem Relation Age of Onset   Heart Problems Mother    Stroke Mother    Edema Mother    Squamous cell carcinoma Mother    Brain cancer Father    Kidney disease Sister        kidney transplant   Diabetes Sister    Heart Problems Sister        triple bypass   Other Sister        liver cyst   Hashimoto's thyroiditis Sister    Hypothyroidism Sister    Congenital adrenal hyperplasia Sister    Asthma Sister    Narcolepsy Sister    Joint hypermobility Sister    ADD / ADHD Sister    Asthma Son    Eczema Son    Hashimoto's thyroiditis Daughter    Past Surgical History:  Procedure Laterality Date   APPENDECTOMY     STAPEDOTOMY Right    Social History   Social History Narrative   Not on file    There is no immunization history on file for this patient.   Objective: Vital Signs: BP 122/83 (BP Location: Right Arm, Patient Position: Sitting, Cuff Size: Normal)   Pulse 60   Resp 14   Ht 5' 9 (1.753 m)   Wt 189 lb 3.2 oz (85.8 kg)   BMI 27.94 kg/m    Physical Exam Vitals and nursing note reviewed.  Constitutional:      Appearance: She is well-developed.  HENT:     Head: Normocephalic and atraumatic.  Eyes:     Conjunctiva/sclera: Conjunctivae normal.  Cardiovascular:     Rate and Rhythm: Normal rate and regular rhythm.     Heart sounds: Normal heart sounds.  Pulmonary:     Effort: Pulmonary effort is normal.     Breath sounds: Normal breath sounds.  Abdominal:     General: Bowel sounds are normal.     Palpations: Abdomen is soft.  Musculoskeletal:     Cervical back: Normal range of motion.  Lymphadenopathy:     Cervical: No cervical adenopathy.  Skin:    General: Skin is warm and dry.     Capillary Refill: Capillary refill takes 2 to 3 seconds.     Comments: No nailbed capillary changes or sclerodactyly was noted.  Neurological:     Mental  Status: She is alert and oriented to person, place, and time.  Psychiatric:        Behavior: Behavior normal.      Musculoskeletal Exam: Cervical, thoracic and lumbar spine were in good range of motion.  She had no SI joint tenderness.  Shoulders, elbow joints, wrist joints, MCPs PIPs and DIPs were in good range of motion with no synovitis.  No nailbed capillary changes were noted.  Hip joints and knee joints with good range of motion.  No warmth swelling  or effusion was noted.  She had no tenderness over her ankles.  She had bilateral bunions and PIP and DIP thickening with no tenderness.  Dorsal spurs were noted.  She had tenderness over right plantar fascia.  CDAI Exam: CDAI Score: -- Patient Global: --; Provider Global: -- Swollen: --; Tender: -- Joint Exam 10/11/2023   No joint exam has been documented for this visit   There is currently no information documented on the homunculus. Go to the Rheumatology activity and complete the homunculus joint exam.  Investigation: No additional findings.  Imaging: No results found.  Recent Labs: No results found for: WBC, HGB, PLT, NA, K, CL, CO2, GLUCOSE, BUN, CREATININE, BILITOT, ALKPHOS, AST, ALT, PROT, ALBUMIN, CALCIUM, GFRAA, QFTBGOLD, QFTBGOLDPLUS  IMPRESSION: 1. There is a 3.4 cm echogenic mass within the right hepatic lobe. This is indeterminate. Recommend further evaluation with pre and post contrast-enhanced abdominal MRI. 2. No cholelithiasis or sonographic evidence for acute cholecystitis. 3. Mildly increased hepatic parenchymal echogenicity suggestive of steatosis.  Electronically Signed   By: Bard Moats M.D.   On: 03/03/2023 09:19     March 07, 2023 RF<10.0 negative, ANA 1: 320 positive, sed rate 7, C-reactive protein<1.0, uric acid 4.6  Speciality Comments: No specialty comments available.  Procedures:  No procedures performed Allergies: Patient has no known allergies.    Assessment / Plan:     Visit Diagnoses: Primary osteoarthritis of both feet-patient complains of pain and discomfort in her feet.  She had bilateral dorsal spurs.  Bilateral bunions and DIP and PIP thickening was noted.  Callus formation was noted over the medial aspect of her both first toes.  She is also struggling from plantar fasciitis currently.  She was evaluated by Dr. Gershon.  I reviewed the x-ray findings with the patient.  No synovitis was noted on the examination.  Positive ANA (antinuclear antibody) -she was found to have positive ANA by her PCP.  She denies any history of oral ulcers, nasal ulcers, malar rash, photosensitivity, lymphadenopathy or inflammatory arthritis.  I will obtain additional labs to complete the workup.  Plan: CBC with Differential/Platelet, Comprehensive metabolic panel with GFR, Protein / creatinine ratio, urine, CK, ANA, Anti-scleroderma antibody, RNP Antibody, Anti-Smith antibody, Sjogrens syndrome-A extractable nuclear antibody, Sjogrens syndrome-B extractable nuclear antibody, Anti-DNA antibody, double-stranded, C3 and C4, Beta-2  glycoprotein antibodies, Cardiolipin antibodies, IgG, IgM, IgA  Raynaud's disease without gangrene-she gives history of Raynaud's phenomenon since she was in her 30s.  She denies any history of digital ulcers.  She states her symptoms are more prominent during the winter months.  She decreased capillary refill without any nailbed capillary changes or sclerodactyly.  A handout on Raynaud's phenomenon was provided.  Plantar fasciitis of right foot-she was evaluated by the podiatrist and is doing some stretching exercises.  Proper fitting shoes with arch support were advised.  Multiple pulmonary nodules-she was found to have more multiple pulmonary nodules on the CT cardiac scoring.  I will refer her to a pulmonologist.  History of colon polyps-followed by gastroenterologist.  Liver lesion-she was found to have a liver lesion on the  ultrasound of the abdomen which was reviewed with the patient.  Patient has a gastroenterologist.  I advised her to take advice from her gastroenterologist.  Other medical problems are listed as follows:  Acquired hypothyroidism  Venous insufficiency (chronic) (peripheral)  Iron deficiency  Vitamin D  deficiency -she gives history of vitamin D  deficiency and would like to know her vitamin D  level.  She has been  experiencing some fatigue.  Plan: VITAMIN D  25 Hydroxy (Vit-D Deficiency, Fractures)  B12 deficiency -she takes vitamin B-12.  Plan: Vitamin B12  Orders: Orders Placed This Encounter  Procedures   CBC with Differential/Platelet   Comprehensive metabolic panel with GFR   Protein / creatinine ratio, urine   CK   ANA   Anti-scleroderma antibody   RNP Antibody   Anti-Smith antibody   Sjogrens syndrome-A extractable nuclear antibody   Sjogrens syndrome-B extractable nuclear antibody   Anti-DNA antibody, double-stranded   C3 and C4   Beta-2  glycoprotein antibodies   Cardiolipin antibodies, IgG, IgM, IgA   VITAMIN D  25 Hydroxy (Vit-D Deficiency, Fractures)   Vitamin B12   Ambulatory referral to Pulmonology   No orders of the defined types were placed in this encounter.    Follow-Up Instructions: Return for Positive ANA, Raynauds, pain feet.   Maya Nash, MD  Note - This record has been created using Animal nutritionist.  Chart creation errors have been sought, but may not always  have been located. Such creation errors do not reflect on  the standard of medical care.

## 2023-10-11 ENCOUNTER — Other Ambulatory Visit: Payer: Self-pay | Admitting: Internal Medicine

## 2023-10-11 ENCOUNTER — Ambulatory Visit: Attending: Rheumatology | Admitting: Rheumatology

## 2023-10-11 ENCOUNTER — Encounter: Payer: Self-pay | Admitting: Rheumatology

## 2023-10-11 VITALS — BP 122/83 | HR 60 | Resp 14 | Ht 69.0 in | Wt 189.2 lb

## 2023-10-11 DIAGNOSIS — R768 Other specified abnormal immunological findings in serum: Secondary | ICD-10-CM | POA: Diagnosis not present

## 2023-10-11 DIAGNOSIS — M19071 Primary osteoarthritis, right ankle and foot: Secondary | ICD-10-CM

## 2023-10-11 DIAGNOSIS — E559 Vitamin D deficiency, unspecified: Secondary | ICD-10-CM | POA: Diagnosis not present

## 2023-10-11 DIAGNOSIS — E038 Other specified hypothyroidism: Secondary | ICD-10-CM

## 2023-10-11 DIAGNOSIS — M722 Plantar fascial fibromatosis: Secondary | ICD-10-CM | POA: Diagnosis not present

## 2023-10-11 DIAGNOSIS — I872 Venous insufficiency (chronic) (peripheral): Secondary | ICD-10-CM

## 2023-10-11 DIAGNOSIS — M79671 Pain in right foot: Secondary | ICD-10-CM

## 2023-10-11 DIAGNOSIS — R160 Hepatomegaly, not elsewhere classified: Secondary | ICD-10-CM

## 2023-10-11 DIAGNOSIS — F902 Attention-deficit hyperactivity disorder, combined type: Secondary | ICD-10-CM

## 2023-10-11 DIAGNOSIS — M19072 Primary osteoarthritis, left ankle and foot: Secondary | ICD-10-CM

## 2023-10-11 DIAGNOSIS — I7300 Raynaud's syndrome without gangrene: Secondary | ICD-10-CM

## 2023-10-11 DIAGNOSIS — E039 Hypothyroidism, unspecified: Secondary | ICD-10-CM

## 2023-10-11 DIAGNOSIS — E611 Iron deficiency: Secondary | ICD-10-CM

## 2023-10-11 DIAGNOSIS — E538 Deficiency of other specified B group vitamins: Secondary | ICD-10-CM | POA: Diagnosis not present

## 2023-10-11 DIAGNOSIS — Z860100 Personal history of colon polyps, unspecified: Secondary | ICD-10-CM

## 2023-10-11 DIAGNOSIS — K769 Liver disease, unspecified: Secondary | ICD-10-CM

## 2023-10-11 DIAGNOSIS — R918 Other nonspecific abnormal finding of lung field: Secondary | ICD-10-CM

## 2023-10-11 NOTE — Patient Instructions (Signed)
 Raynaud's Phenomenon  Raynaud's phenomenon is a condition that affects the blood vessels (arteries) that carry blood to the fingers and toes. The arteries that supply blood to the ears, lips, nipples, or the tip of the nose might also be affected. Raynaud's phenomenon causes the arteries to become narrow temporarily (spasm). As a result, the flow of blood to the affected areas is temporarily decreased. This usually occurs in response to cold temperatures or stress. During an attack, the skin in the affected areas turns white, then blue, and finally red. A person may also feel tingling or numbness in those areas. Attacks usually last for only a brief period, and then the blood flow to the area returns to normal. In most cases, Raynaud's phenomenon does not cause serious health problems. What are the causes? In many cases, the cause of this condition is not known. The condition may occur on its own (primary Raynaud's phenomenon) or may be associated with other diseases or factors (secondary Raynaud's phenomenon). Possible causes may include: Diseases or medical conditions that damage the arteries. Injuries and repetitive actions that hurt the hands or feet. Being exposed to certain chemicals. Taking medicines that narrow the arteries. Other medical conditions, such as lupus, scleroderma, rheumatoid arthritis, thyroid problems, blood disorders, Sjogren syndrome, or atherosclerosis. What increases the risk? The following factors may make you more likely to develop this condition: Being 18-78 years old. Being female. Having a family history of Raynaud's phenomenon. Living in a cold climate. Smoking. What are the signs or symptoms? Symptoms of this condition usually occur when you are exposed to cold temperatures or when you have emotional stress. The symptoms may last for a few minutes or up to several hours. They usually affect your fingers but may also affect your toes, nipples, lips, ears, or the  tip of your nose. Symptoms may include: Changes in skin color. The skin in the affected areas will turn pale or white. The skin may then change from white to bluish to red as normal blood flow returns to the area. Numbness, tingling, or pain in the affected areas. In severe cases, symptoms may include: Skin sores. Tissues decaying and dying (gangrene). How is this diagnosed? This condition may be diagnosed based on: Your symptoms and medical history. A physical exam. During the exam, you may be asked to put your hands in cold water to check for a reaction to cold temperature. Tests, such as: Blood tests to check for other diseases or conditions. A test to check the movement of blood through your arteries and veins (vascular ultrasound). A test in which the skin at the base of your fingernail is examined under a microscope (nailfold capillaroscopy). How is this treated? During an episode, you can take actions to help symptoms go away faster. Options include moving your arms around in a windmill pattern, warming your fingers under warm water, or placing your fingers in a warm body fold, such as your armpit. Long-term treatment for this condition often involves making lifestyle changes and taking steps to control your exposure to cold temperature. For more severe cases, medicine (calcium channel blockers) may be used to improve blood circulation. Follow these instructions at home: Avoiding cold temperatures Take these steps to avoid exposure to cold: If possible, stay indoors during cold weather. When you go outside during cold weather, dress in layers and wear mittens, a hat, a scarf, and warm footwear. Wear mittens or gloves when handling ice or frozen food. Use holders for glasses or cans containing  cold drinks. Let warm water run for a while before taking a shower or bath. Warm up the car before driving in cold weather. Lifestyle If possible, avoid stressful and emotional situations. Try  to find ways to manage your stress, such as: Exercise. Yoga. Meditation. Biofeedback. Do not use any products that contain nicotine or tobacco. These products include cigarettes, chewing tobacco, and vaping devices, such as e-cigarettes. If you need help quitting, ask your health care provider. Avoid secondhand smoke. Limit your use of caffeine. Switch to decaffeinated coffee, tea, and soda. Avoid chocolate. Avoid vibrating tools and machinery. General instructions Protect your hands and feet from injuries, cuts, or bruises. Avoid wearing tight rings or wristbands. Wear loose fitting socks and comfortable, roomy shoes. Take over-the-counter and prescription medicines only as told by your health care provider. Where to find support Raynaud's Association: www.raynauds.org Where to find more information [employer] of Arthritis and Musculoskeletal and Skin Diseases: www.niams.http://www.myers.net/ Contact a health care provider if: Your discomfort becomes worse despite lifestyle changes. You develop sores on your fingers or toes that do not heal. You have breaks in the skin on your fingers or toes. You have a fever. You have pain or swelling in your joints. You have a rash. Your symptoms occur on only one side of your body. Get help right away if: Your fingers or toes turn black. You have severe pain in the affected areas. These symptoms may represent a serious problem that is an emergency. Do not wait to see if the symptoms will go away. Get medical help right away. Call your local emergency services (911 in the [place] Do not drive yourself to the hospital. Summary Raynaud's phenomenon is a condition that affects the arteries that carry blood to the fingers, toes, ears, lips, nipples, or the tip of the nose. In many cases, the cause of this condition is not known. Symptoms of this condition include changes in skin color along with numbness and tingling in the affected area. Treatment for  this condition includes lifestyle changes and reducing exposure to cold temperatures. Medicines may be used for severe cases of the condition. Contact your health care provider if your condition worsens despite treatment. This information is not intended to replace advice given to you by your health care provider. Make sure you discuss any questions you have with your health care provider. Document Revised: 03/25/2020 Document Reviewed: 03/25/2020 Elsevier Patient Education  2024 [employer].

## 2023-10-14 ENCOUNTER — Ambulatory Visit: Payer: Self-pay | Admitting: Rheumatology

## 2023-10-14 LAB — COMPREHENSIVE METABOLIC PANEL WITH GFR
AG Ratio: 1.8 (calc) (ref 1.0–2.5)
ALT: 17 U/L (ref 6–29)
AST: 18 U/L (ref 10–35)
Albumin: 4.4 g/dL (ref 3.6–5.1)
Alkaline phosphatase (APISO): 67 U/L (ref 37–153)
BUN: 21 mg/dL (ref 7–25)
CO2: 31 mmol/L (ref 20–32)
Calcium: 9.8 mg/dL (ref 8.6–10.4)
Chloride: 106 mmol/L (ref 98–110)
Creat: 0.87 mg/dL (ref 0.50–1.03)
Globulin: 2.4 g/dL (ref 1.9–3.7)
Glucose, Bld: 93 mg/dL (ref 65–99)
Potassium: 4.5 mmol/L (ref 3.5–5.3)
Sodium: 142 mmol/L (ref 135–146)
Total Bilirubin: 0.8 mg/dL (ref 0.2–1.2)
Total Protein: 6.8 g/dL (ref 6.1–8.1)
eGFR: 80 mL/min/1.73m2 (ref >=60)

## 2023-10-14 LAB — CARDIOLIPIN ANTIBODIES, IGG, IGM, IGA
Anticardiolipin IgA: 2.2 [APL'U]/mL (ref <20.0)
Anticardiolipin IgG: <2 [GPL'U]/mL (ref <20.0)
Anticardiolipin IgM: <2 [MPL'U]/mL (ref <20.0)

## 2023-10-14 LAB — C3 AND C4
C3 Complement: 124 mg/dL (ref 83–193)
C4 Complement: 23 mg/dL (ref 15–57)

## 2023-10-14 LAB — RNP ANTIBODY: Ribonucleic Protein(ENA) Antibody, IgG: <1 AI

## 2023-10-14 LAB — CBC WITH DIFFERENTIAL/PLATELET
Absolute Lymphocytes: 1312 {cells}/uL (ref 850–3900)
Absolute Monocytes: 288 {cells}/uL (ref 200–950)
Basophils Absolute: 28 {cells}/uL (ref 0–200)
Basophils Relative: 0.7 %
Eosinophils Absolute: 80 {cells}/uL (ref 15–500)
Eosinophils Relative: 2 %
HCT: 37.9 % (ref 35.0–45.0)
Hemoglobin: 12.1 g/dL (ref 11.7–15.5)
MCH: 29.9 pg (ref 27.0–33.0)
MCHC: 31.9 g/dL — ABNORMAL LOW (ref 32.0–36.0)
MCV: 93.6 fL (ref 80.0–100.0)
MPV: 10.1 fL (ref 7.5–12.5)
Monocytes Relative: 7.2 %
Neutro Abs: 2292 {cells}/uL (ref 1500–7800)
Neutrophils Relative %: 57.3 %
Platelets: 214 Thousand/uL (ref 140–400)
RBC: 4.05 Million/uL (ref 3.80–5.10)
RDW: 13.3 % (ref 11.0–15.0)
Total Lymphocyte: 32.8 %
WBC: 4 Thousand/uL (ref 3.8–10.8)

## 2023-10-14 LAB — ANTI-SCLERODERMA ANTIBODY: Scleroderma (Scl-70) (ENA) Antibody, IgG: <1 AI

## 2023-10-14 LAB — SJOGRENS SYNDROME-B EXTRACTABLE NUCLEAR ANTIBODY: SSB (La) (ENA) Antibody, IgG: <1 AI

## 2023-10-14 LAB — ANTI-SMITH ANTIBODY: ENA SM Ab Ser-aCnc: <1 AI

## 2023-10-14 LAB — VITAMIN B12: Vitamin B-12: 376 pg/mL (ref 200–1100)

## 2023-10-14 LAB — ANTI-NUCLEAR AB-TITER (ANA TITER): ANA Titer 1: 1:640 {titer} — ABNORMAL HIGH

## 2023-10-14 LAB — BETA-2 GLYCOPROTEIN ANTIBODIES
Beta-2 Glyco 1 IgA: 2.6 U/mL (ref <20.0)
Beta-2 Glyco 1 IgM: <2 U/mL (ref <20.0)
Beta-2 Glyco I IgG: <2 U/mL (ref <20.0)

## 2023-10-14 LAB — PROTEIN / CREATININE RATIO, URINE
Creatinine, Urine: 135 mg/dL (ref 20–275)
Protein/Creat Ratio: 44 mg/g{creat} (ref 24–184)
Protein/Creatinine Ratio: 0.044 mg/mg{creat} (ref 0.024–0.184)
Total Protein, Urine: 6 mg/dL (ref 5–24)

## 2023-10-14 LAB — SJOGRENS SYNDROME-A EXTRACTABLE NUCLEAR ANTIBODY: SSA (Ro) (ENA) Antibody, IgG: <1 AI

## 2023-10-14 LAB — ANA: Anti Nuclear Antibody (ANA): POSITIVE — AB

## 2023-10-14 LAB — ANTI-DNA ANTIBODY, DOUBLE-STRANDED: ds DNA Ab: <1 [IU]/mL

## 2023-10-14 LAB — VITAMIN D 25 HYDROXY (VIT D DEFICIENCY, FRACTURES): Vit D, 25-Hydroxy: 60 ng/mL (ref 30–100)

## 2023-10-14 LAB — CK: Total CK: 84 U/L (ref 21–240)

## 2023-10-14 NOTE — Progress Notes (Signed)
 ANA positive, all other labs were within normal limits.  I will discuss results at the follow-up visit.

## 2023-10-19 ENCOUNTER — Ambulatory Visit

## 2023-10-19 VITALS — BP 124/76 | HR 57 | Ht 69.0 in | Wt 189.8 lb

## 2023-10-19 DIAGNOSIS — R911 Solitary pulmonary nodule: Secondary | ICD-10-CM | POA: Diagnosis not present

## 2023-10-19 NOTE — Progress Notes (Signed)
 New Patient Pulmonology Office Visit   Subjective:  Patient ID: Michele Mccarthy, female    DOB: 08-Oct-1970  MRN: [id number]  Referred by: Michele Reiter, MD  CC:  Chief Complaint  Patient presents with   Consult    Consult for lung nodules.  Pt also reports a dry deep cough once or twice a day.     HPI Michele Mccarthy is a 53 y.o. female with colon polyp, hypothyroidism, iron deficiency presents for evaluation of multiple pulmonary nodule. New 3.4 cm liver mass which is undergoing evaluation.   Symptoms: intermittent cough may cough once a day. No phlegm. No F/C, wt loss, CP. No SOB.   Patient had cardiac CT which showed multiple small pulmonary nodules largest measuring 4 mm.  And she is coming in for follow-up.  Labs: ANA positive, 1 nuclear dense speckled.  Titer 1:640. UTD w Pap smear and mammogram.   Fam hx cancer: GBM in dad.   Lung Health: Functional status: unlimited.  Smoking: never.  Occupational exposure/pets: a dog and 2 cats. Used to have a parakeet 10 years ago. Is a Public house manager. No exposures.   Allergies: Patient has no known allergies.  Current Outpatient Medications:    B Complex Vitamins (B COMPLEX PO), Take by mouth daily., Disp: , Rfl:    Cholecalciferol (VITAMIN D3 PO), Take 5,000 Units by mouth daily., Disp: , Rfl:    Ferrous Sulfate (IRON PO), Take by mouth daily. With vitamin C, Disp: , Rfl:    levothyroxine (SYNTHROID) 100 MCG tablet, Take 100 mcg by mouth daily before breakfast., Disp: , Rfl:    MAGNESIUM GLYCINATE PO, Take 240 mg by mouth daily., Disp: , Rfl:    Menaquinone-7 (VITAMIN K2 PO), Take 100 mcg by mouth daily., Disp: , Rfl:    QUERCETIN PO, Take by mouth as needed., Disp: , Rfl:  Past Medical History:  Diagnosis Date   Liver cyst    per patient   Past Surgical History:  Procedure Laterality Date   APPENDECTOMY     STAPEDOTOMY Right    Family History  Problem Relation Age of Onset   Heart Problems Mother     Stroke Mother    Edema Mother    Squamous cell carcinoma Mother    Brain cancer Father    Kidney disease Sister        kidney transplant   Diabetes Sister    Heart Problems Sister        triple bypass   Other Sister        liver cyst   Hashimoto's thyroiditis Sister    Hypothyroidism Sister    Congenital adrenal hyperplasia Sister    Asthma Sister    Narcolepsy Sister    Joint hypermobility Sister    ADD / ADHD Sister    Asthma Son    Eczema Son    Hashimoto's thyroiditis Daughter    Social History   Socioeconomic History   Marital status: Married    Spouse name: Not on file   Number of children: Not on file   Years of education: Not on file   Highest education level: Not on file  Occupational History   Not on file  Tobacco Use   Smoking status: Never    Passive exposure: Never   Smokeless tobacco: Never  Vaping Use   Vaping status: Never Used  Substance and Sexual Activity   Alcohol use: Yes    Comment: rarely   Drug use: Never  Sexual activity: Not on file  Other Topics Concern   Not on file  Social History Narrative   Not on file   Social Drivers of Health   Financial Resource Strain: Not on file  Food Insecurity: Not on file  Transportation Needs: Not on file  Physical Activity: Not on file  Stress: Not on file  Social Connections: Not on file  Intimate Partner Violence: Not on file       Objective:  BP 124/76   Pulse (!) 57   Ht 5' 9 (1.753 m)   Wt 189 lb 12.8 oz (86.1 kg)   SpO2 98% Comment: RA  BMI 28.03 kg/m  BMI Readings from Last 3 Encounters:  10/19/23 28.03 kg/m  10/11/23 27.94 kg/m  04/15/22 26.43 kg/m    General: not in distress patient appearing healthy Lungs: clear to auscultation bilaterally.  Heart: regular rate rhythm, no murmur appreciated.  Abdomen: non tender, non distended. Normal BS.  Neuro: axox3.  Move all extremities.   Diagnostic Review:  Last CBC Lab Results  Component Value Date   WBC 4.0 10/11/2023    HGB 12.1 10/11/2023   HCT 37.9 10/11/2023   MCV 93.6 10/11/2023   MCH 29.9 10/11/2023   RDW 13.3 10/11/2023   PLT 214 10/11/2023       Assessment & Plan:   Assessment & Plan Pulmonary nodule Multiple pulmonary nodule seen on cardiac CT scan which was reviewed by me personally. Largest 4mm.  Mostly benign Will get CT chest to evaluate entire parenchyma.  If still has similar sized multiple nodule will get optional CT scan in 12 month per Fleischner.   Orders Placed This Encounter  Procedures   CT Chest Wo Contrast      No follow-ups on file.   Michele Rini, MD

## 2023-10-19 NOTE — Patient Instructions (Addendum)
 Notification of test results are managed in the following manner: If there are any recommendations or changes to the plan of care discussed in office today, we will contact you and let you know what they are. If you do not hear from us , then your results are normal/expected and you can view them through your MyChart account, or a letter will be sent to you. Thank you again for trusting us  with your care [facility].

## 2023-10-20 ENCOUNTER — Telehealth: Payer: Self-pay

## 2023-10-20 NOTE — Telephone Encounter (Signed)
 Copied from CRM [id number]. Topic: Clinical - Medication Prior Auth >> Oct 20, 2023 12:41 PM Nathanel DEL wrote: Reason for CRM: Lolita from [facility] medical benefits management states request for CT chest w/o contrast has been approved  dates:  12/181/2025-01/17/2024 Shara number  [id number]  FYI Dr Pawar

## 2023-10-24 ENCOUNTER — Ambulatory Visit
Admission: RE | Admit: 2023-10-24 | Discharge: 2023-10-24 | Disposition: A | Discharge status: Home / Self Care | Source: Ambulatory Visit | Attending: Internal Medicine | Admitting: Internal Medicine

## 2023-10-24 DIAGNOSIS — D1800 Hemangioma unspecified site: Secondary | ICD-10-CM | POA: Diagnosis not present

## 2023-10-24 DIAGNOSIS — R160 Hepatomegaly, not elsewhere classified: Secondary | ICD-10-CM

## 2023-10-24 MED ORDER — GADOPICLENOL 0.5 MMOL/ML IV SOLN
9.0000 mL | Freq: Once | INTRAVENOUS | Status: AC | PRN
Start: 2023-10-24 — End: 2023-10-24
  Administered 2023-10-24: 9 mL via INTRAVENOUS

## 2023-10-27 NOTE — Progress Notes (Signed)
 Office Visit Note  Patient: Michele Mccarthy             Date of Birth: Sep 24, 1970           MRN: [id number]             PCP: Verena Mems, MD Referring: Verena Mems, MD Visit Date: 11/10/2023 Occupation: Data Unavailable  Subjective:  Positive ANA, Raynauds  History of Present Illness: Michele Mccarthy is a 53 y.o. female with positive ANA and Raynaud's.  She returns today after her initial visit on October 11, 2023.  She denies any history for ulcers, nasal ulcers, malar rash, photosensitivity, lymphadenopathy or inflammatory arthritis.  She continues to have some discomfort in the right plantar fascia.  She states her Raynaud's symptoms are more prominent during the winter months.  The Raynaud's symptoms are mild.  She denies any history of digital ulcers.  She was evaluated by pulmonologist and had a CT scan which showed stable pulmonary nodules and no further workup was needed.    Activities of Daily Living:  Patient reports morning stiffness for less than 5 minutes.   Patient Denies nocturnal pain.  Difficulty dressing/grooming: Denies Difficulty climbing stairs: Denies Difficulty getting out of chair: Denies Difficulty using hands for taps, buttons, cutlery, and/or writing: Denies  Review of Systems  Constitutional:  Negative for fatigue.  HENT:  Negative for mouth sores and mouth dryness.   Eyes:  Negative for dryness.  Respiratory:  Negative for shortness of breath.   Cardiovascular:  Negative for chest pain and palpitations.  Gastrointestinal:  Negative for blood in stool, constipation and diarrhea.  Endocrine: Negative for increased urination.  Genitourinary:  Negative for involuntary urination.  Musculoskeletal:  Positive for joint pain, joint pain, joint swelling, morning stiffness and muscle tenderness. Negative for gait problem, myalgias, muscle weakness and myalgias.  Skin:  Negative for color change, rash, hair loss and sensitivity to sunlight.   Allergic/Immunologic: Negative for susceptible to infections.  Neurological:  Negative for dizziness and headaches.  Hematological:  Negative for swollen glands.  Psychiatric/Behavioral:  Negative for depressed mood and sleep disturbance. The patient is not nervous/anxious.     PMFS History:  Patient Active Problem List   Diagnosis Date Noted   Acquired hypothyroidism 10/11/2023   Venous insufficiency (chronic) (peripheral) 10/11/2023   Iron deficiency 10/11/2023   History of colon polyps 10/11/2023    Past Medical History:  Diagnosis Date   Liver cyst    per patient    Family History  Problem Relation Age of Onset   Heart Problems Mother    Stroke Mother    Edema Mother    Squamous cell carcinoma Mother    Brain cancer Father    Kidney disease Sister        kidney transplant   Diabetes Sister    Heart Problems Sister        triple bypass   Other Sister        liver cyst   Hashimoto's thyroiditis Sister    Hypothyroidism Sister    Congenital adrenal hyperplasia Sister    Asthma Sister    Narcolepsy Sister    Joint hypermobility Sister    ADD / ADHD Sister    Asthma Son    Eczema Son    Hashimoto's thyroiditis Daughter    Past Surgical History:  Procedure Laterality Date   APPENDECTOMY     STAPEDOTOMY Right    Social History   Tobacco Use   Smoking status: Never  Passive exposure: Never   Smokeless tobacco: Never  Vaping Use   Vaping status: Never Used  Substance Use Topics   Alcohol use: Yes    Comment: rarely   Drug use: Never   Social History   Social History Narrative   Not on file      There is no immunization history on file for this patient.   Objective: Vital Signs: BP 115/75   Pulse (!) 55   Temp 97.7 F (36.5 C)   Resp 14   Ht 5' 9 (1.753 m)   Wt 191 lb (86.6 kg)   BMI 28.21 kg/m    Physical Exam Vitals and nursing note reviewed.  Constitutional:      Appearance: She is well-developed.  HENT:     Head: Normocephalic  and atraumatic.  Eyes:     Conjunctiva/sclera: Conjunctivae normal.  Cardiovascular:     Rate and Rhythm: Normal rate and regular rhythm.     Heart sounds: Normal heart sounds.  Pulmonary:     Effort: Pulmonary effort is normal.     Breath sounds: Normal breath sounds.  Abdominal:     General: Bowel sounds are normal.     Palpations: Abdomen is soft.  Musculoskeletal:     Cervical back: Normal range of motion.  Lymphadenopathy:     Cervical: No cervical adenopathy.  Skin:    General: Skin is warm and dry.     Capillary Refill: Capillary refill takes less than 2 seconds.  Neurological:     Mental Status: She is alert and oriented to person, place, and time.  Psychiatric:        Behavior: Behavior normal.      Musculoskeletal Exam: Cervical, thoracic and lumbar spine were in good range of motion.  There was no SI joint tenderness.  Shoulder joints, elbow joints, wrist joints, MCPs, PIPs and DIPs were in good range of motion with no synovitis.  Hip joints and knee joints were in good range of motion without any warmth swelling or effusion.  There was no tenderness over ankles or MTPs.   CDAI Exam: CDAI Score: -- Patient Global: --; Provider Global: -- Swollen: --; Tender: -- Joint Exam 11/10/2023   No joint exam has been documented for this visit   There is currently no information documented on the homunculus. Go to the Rheumatology activity and complete the homunculus joint exam.  Investigation: No additional findings.  Imaging: CT Chest Wo Contrast Result Date: 11/09/2023 EXAM: CT CHEST WITHOUT CONTRAST 11/03/2023 11:57:32 AM TECHNIQUE: CT of the chest was performed without the administration of intravenous contrast. Multiplanar reformatted images are provided for review. Automated exposure control, iterative reconstruction, and/or weight based adjustment of the mA/kV was utilized to reduce the radiation dose to as low as reasonably achievable. COMPARISON: Comparison is  made to a prior examination of December 28, 2022. CLINICAL HISTORY: Pulmonary nodule. FINDINGS: MEDIASTINUM: No significant coronary artery calcification. Global cardiac size is within normal limits. No pericardial effusion. Central pulmonary arteries are of normal caliber. Mild atherosclerotic calcification is present within the thoracic aorta. No aortic aneurysm. LYMPH NODES: No mention of lymphadenopathy. LUNGS AND PLEURA: Scattered 3-4 mm pulmonary nodules within the right upper lobe and left lower lobe are stable since the prior examination and are safely considered benign. No new focal pulmonary nodules are identified. No pneumothorax or pleural effusion. No central obstructing lesion. SOFT TISSUES/BONES: No acute abnormality of the bones or soft tissues. UPPER ABDOMEN: A 2.8 cm hypodense lesion within  the right hepatic dome is better characterized on a prior MRI examination of October 24, 2023, as a benign cavernous hemangioma. No acute abnormality is present within the visualized upper abdomen. IMPRESSION: 1. Stable scattered 3-4 mm pulmonary nodules within the right upper lobe and left lower lobe, safely considered benign. Electronically signed by: Dorethia Molt MD 11/09/2023 03:39 AM EDT RP Workstation: [id number]   MR ABDOMEN WWO CONTRAST Result Date: 10/24/2023 CLINICAL DATA:  Liver mass EXAM: MRI ABDOMEN WITHOUT AND WITH CONTRAST TECHNIQUE: Multiplanar multisequence MR imaging of the abdomen was performed both before and after the administration of intravenous contrast. CONTRAST:  Vueway  9 ml given out of 10 mL Vial 1 ml wasted COMPARISON:  Ultrasound abdomen March 03, 2023. FINDINGS: Lower chest: Unremarkable Hepatobiliary: Right hepatic lobe segment 8 T2 hyperintense lesion measuring 3.1 cm with postcontrast nodular peripheral discontinuous enhancement and gradual filling consistent with hemangioma. This finding was previously measured the same on prior ultrasound. There are 2 subcentimeter T2  hyperintense smaller lesions lesion in posterior aspect of segment 7 and in segment 5 without significant postcontrast enhancement likely representing small cysts (13/39, 16). These lesions are too small to definitely characterize. No biliary dilatation. Gallbladder is unremarkable. Pancreas: No mass, inflammatory changes, or other parenchymal abnormality identified. Spleen: T2 hyperintense splenic lesion in posterior subcapsular measuring 9 mm without abnormal enhancement likely cysts. Adrenals/Urinary Tract: Subcentimeter left kidney cortical cysts, too small to characterize no imaging follow-up required. No hydronephrosis. Both adrenal glands are normal. Stomach/Bowel: Visualized portions within the abdomen are unremarkable. Vascular/Lymphatic: No pathologically enlarged lymph nodes identified. No abdominal aortic aneurysm demonstrated. Other:  None. Musculoskeletal: No suspicious bone lesions identified. IMPRESSION: Hepatic lesion in segment 8 with characteristics consistent with hemangioma. Few additional punctate liver cysts. Splenic subcapsular subcentimeter cyst. Left kidney cortical cyst, which does not require imaging follow-up. Electronically Signed   By: Megan  Zare M.D.   On: 10/24/2023 20:00    Recent Labs: Lab Results  Component Value Date   WBC 4.0 10/11/2023   HGB 12.1 10/11/2023   PLT 214 10/11/2023   NA 142 10/11/2023   K 4.5 10/11/2023   CL 106 10/11/2023   CO2 31 10/11/2023   GLUCOSE 93 10/11/2023   BUN 21 10/11/2023   CREATININE 0.87 10/11/2023   BILITOT 0.8 10/11/2023   AST 18 10/11/2023   ALT 17 10/11/2023   PROT 6.8 10/11/2023   CALCIUM 9.8 10/11/2023   October 11, 2023 urine protein creatinine ratio normal, anticardiolipin negative, beta-2  GP 1 negative, ANA 1: 640 NS, ENA negative, C3-C4 normal, CK 84, vitamin D  60, vitamin B12 376  Speciality Comments: No specialty comments available.  Procedures:  No procedures performed Allergies: Patient has no known  allergies.   Assessment / Plan:     Visit Diagnoses: Positive ANA (antinuclear antibody) - Positive ANA 1: 640 NS, ENA negative.  Complements normal, anticardiolipin antibodies normal, urine protein creatinine show normal.  Lab results were discussed with patient at length.  I discussed with patient that she does not have any clinical features of autoimmune disease at this point.  However if she develops any new symptoms she should notify me.  She denies any history of oral ulcers, nasal ulcers, malar rash, photosensitivity, Raynaud's, lymphadenopathy or hair loss.  There is no history of inflammatory arthritis.  I will recheck labs in 1 year.  Raynaud's disease without gangrene - Since her 30s.  Her Raynaud's symptoms have been mild and manageable.  Detailed counsel regarding Raynaud's was provided and  handout was placed in the AVS.  Keeping core temperature warm and warm clothing was discussed.  Primary osteoarthritis of both feet - Callus formation noted.  History of recurrent plantar fasciitis.  She is followed by Dr. Gershon.  Plantar fasciitis of right foot-she gets mild symptoms of plantar fasciitis which are manageable with orthotics.  Multiple pulmonary nodules -patient was evaluated by pulmonologist.  Other medical problems are listed as follows:  History of colon polyps  Liver lesion - Patient was to follow-up with her gastroenterologist.  Acquired hypothyroidism  Venous insufficiency (chronic) (peripheral)  Iron deficiency  B12 deficiency  Vitamin D  deficiency  Orders: Orders Placed This Encounter  Procedures   Protein / creatinine ratio, urine   CBC with Differential/Platelet   Comprehensive metabolic panel with GFR   Anti-DNA antibody, double-stranded   C3 and C4   Sedimentation rate   ANA   No orders of the defined types were placed in this encounter.    Follow-Up Instructions: Return in about 1 year (around 11/09/2024) for +ANA, Raynauds.   Maya Nash, MD  Note - This record has been created using Animal nutritionist.  Chart creation errors have been sought, but may not always  have been located. Such creation errors do not reflect on  the standard of medical care.

## 2023-11-03 ENCOUNTER — Ambulatory Visit (HOSPITAL_BASED_OUTPATIENT_CLINIC_OR_DEPARTMENT_OTHER)
Admission: RE | Admit: 2023-11-03 | Discharge: 2023-11-03 | Disposition: A | Discharge status: Home / Self Care | Source: Ambulatory Visit

## 2023-11-03 DIAGNOSIS — R918 Other nonspecific abnormal finding of lung field: Secondary | ICD-10-CM | POA: Diagnosis not present

## 2023-11-03 DIAGNOSIS — D1800 Hemangioma unspecified site: Secondary | ICD-10-CM | POA: Diagnosis not present

## 2023-11-03 DIAGNOSIS — R911 Solitary pulmonary nodule: Secondary | ICD-10-CM | POA: Diagnosis not present

## 2023-11-09 ENCOUNTER — Ambulatory Visit: Payer: Self-pay

## 2023-11-10 ENCOUNTER — Encounter: Payer: Self-pay | Admitting: Rheumatology

## 2023-11-10 ENCOUNTER — Ambulatory Visit: Attending: Rheumatology | Admitting: Rheumatology

## 2023-11-10 VITALS — BP 115/75 | HR 55 | Temp 97.7°F | Resp 14 | Ht 69.0 in | Wt 191.0 lb

## 2023-11-10 DIAGNOSIS — M19071 Primary osteoarthritis, right ankle and foot: Secondary | ICD-10-CM | POA: Diagnosis not present

## 2023-11-10 DIAGNOSIS — K769 Liver disease, unspecified: Secondary | ICD-10-CM

## 2023-11-10 DIAGNOSIS — M19072 Primary osteoarthritis, left ankle and foot: Secondary | ICD-10-CM

## 2023-11-10 DIAGNOSIS — I7300 Raynaud's syndrome without gangrene: Secondary | ICD-10-CM | POA: Diagnosis not present

## 2023-11-10 DIAGNOSIS — R918 Other nonspecific abnormal finding of lung field: Secondary | ICD-10-CM

## 2023-11-10 DIAGNOSIS — R7689 Other specified abnormal immunological findings in serum: Secondary | ICD-10-CM | POA: Diagnosis not present

## 2023-11-10 DIAGNOSIS — I872 Venous insufficiency (chronic) (peripheral): Secondary | ICD-10-CM

## 2023-11-10 DIAGNOSIS — M722 Plantar fascial fibromatosis: Secondary | ICD-10-CM | POA: Diagnosis not present

## 2023-11-10 DIAGNOSIS — E538 Deficiency of other specified B group vitamins: Secondary | ICD-10-CM

## 2023-11-10 DIAGNOSIS — Z860100 Personal history of colon polyps, unspecified: Secondary | ICD-10-CM

## 2023-11-10 DIAGNOSIS — E559 Vitamin D deficiency, unspecified: Secondary | ICD-10-CM

## 2023-11-10 DIAGNOSIS — E611 Iron deficiency: Secondary | ICD-10-CM

## 2023-11-10 DIAGNOSIS — E039 Hypothyroidism, unspecified: Secondary | ICD-10-CM

## 2023-11-10 DIAGNOSIS — R768 Other specified abnormal immunological findings in serum: Secondary | ICD-10-CM

## 2023-11-10 NOTE — Patient Instructions (Signed)
 Standing Labs We placed an order today for your standing lab work.   Please have your standing labs drawn in 11/2024  Please have your labs drawn 2 weeks prior to your appointment so that the provider can discuss your lab results at your appointment, if possible.  Please note that you may see your imaging and lab results in MyChart before we have reviewed them. We will contact you once all results are reviewed. Please allow our office up to 72 hours to thoroughly review all of the results before contacting the office for clarification of your results.  WALK-IN LAB HOURS  Monday through Thursday from 8:00 am -12:30 pm and 1:00 pm-4:30 pm and Friday from 8:00 am-12:00 pm.  Patients with office visits requiring labs will be seen before walk-in labs.  You may encounter longer than normal wait times. Please allow additional time. Wait times may be shorter on  Monday and Thursday afternoons.  We do not book appointments for walk-in labs. We appreciate your patience and understanding with our staff.   Labs are drawn by [facility]. Please bring your co-pay at the time of your lab draw.  You may receive a bill from [facility] for your lab work.  Please note if you are on Hydroxychloroquine and and an order has been placed for a Hydroxychloroquine level,  you will need to have it drawn 4 hours or more after your last dose.  If you wish to have your labs drawn at another location, please call the office 24 hours in advance so we can fax the orders.  The office is located at [address], [address], [place], [place] [address]   If you have any questions regarding directions or hours of operation,  please call [phone].   As a reminder, please drink plenty of water prior to coming for your lab work. Thanks!    Raynaud's Phenomenon  Raynaud's phenomenon is a condition that affects the blood vessels (arteries) that carry blood to the fingers and toes. The arteries that supply blood to the ears, lips,  nipples, or the tip of the nose might also be affected. Raynaud's phenomenon causes the arteries to become narrow temporarily (spasm). As a result, the flow of blood to the affected areas is temporarily decreased. This usually occurs in response to cold temperatures or stress. During an attack, the skin in the affected areas turns white, then blue, and finally red. A person may also feel tingling or numbness in those areas. Attacks usually last for only a brief period, and then the blood flow to the area returns to normal. In most cases, Raynaud's phenomenon does not cause serious health problems. What are the causes? In many cases, the cause of this condition is not known. The condition may occur on its own (primary Raynaud's phenomenon) or may be associated with other diseases or factors (secondary Raynaud's phenomenon). Possible causes may include: Diseases or medical conditions that damage the arteries. Injuries and repetitive actions that hurt the hands or feet. Being exposed to certain chemicals. Taking medicines that narrow the arteries. Other medical conditions, such as lupus, scleroderma, rheumatoid arthritis, thyroid  problems, blood disorders, Sjogren syndrome, or atherosclerosis. What increases the risk? The following factors may make you more likely to develop this condition: Being 7-75 years old. Being female. Having a family history of Raynaud's phenomenon. Living in a cold climate. Smoking. What are the signs or symptoms? Symptoms of this condition usually occur when you are exposed to cold temperatures or when you have  emotional stress. The symptoms may last for a few minutes or up to several hours. They usually affect your fingers but may also affect your toes, nipples, lips, ears, or the tip of your nose. Symptoms may include: Changes in skin color. The skin in the affected areas will turn pale or white. The skin may then change from white to bluish to red as normal blood flow  returns to the area. Numbness, tingling, or pain in the affected areas. In severe cases, symptoms may include: Skin sores. Tissues decaying and dying (gangrene). How is this diagnosed? This condition may be diagnosed based on: Your symptoms and medical history. A physical exam. During the exam, you may be asked to put your hands in cold water to check for a reaction to cold temperature. Tests, such as: Blood tests to check for other diseases or conditions. A test to check the movement of blood through your arteries and veins (vascular ultrasound). A test in which the skin at the base of your fingernail is examined under a microscope (nailfold capillaroscopy). How is this treated? During an episode, you can take actions to help symptoms go away faster. Options include moving your arms around in a windmill pattern, warming your fingers under warm water, or placing your fingers in a warm body fold, such as your armpit. Long-term treatment for this condition often involves making lifestyle changes and taking steps to control your exposure to cold temperature. For more severe cases, medicine (calcium channel blockers) may be used to improve blood circulation. Follow these instructions at home: Avoiding cold temperatures Take these steps to avoid exposure to cold: If possible, stay indoors during cold weather. When you go outside during cold weather, dress in layers and wear mittens, a hat, a scarf, and warm footwear. Wear mittens or gloves when handling ice or frozen food. Use holders for glasses or cans containing cold drinks. Let warm water run for a while before taking a shower or bath. Warm up the car before driving in cold weather. Lifestyle If possible, avoid stressful and emotional situations. Try to find ways to manage your stress, such as: Exercise. Yoga. Meditation. Biofeedback. Do not use any products that contain nicotine or tobacco. These products include cigarettes, chewing  tobacco, and vaping devices, such as e-cigarettes. If you need help quitting, ask your health care provider. Avoid secondhand smoke. Limit your use of caffeine. Switch to decaffeinated coffee, tea, and soda. Avoid chocolate. Avoid vibrating tools and machinery. General instructions Protect your hands and feet from injuries, cuts, or bruises. Avoid wearing tight rings or wristbands. Wear loose fitting socks and comfortable, roomy shoes. Take over-the-counter and prescription medicines only as told by your health care provider. Where to find support Raynaud's Association: www.raynauds.org Where to find more information [employer] of Arthritis and Musculoskeletal and Skin Diseases: www.niams.http://www.myers.net/ Contact a health care provider if: Your discomfort becomes worse despite lifestyle changes. You develop sores on your fingers or toes that do not heal. You have breaks in the skin on your fingers or toes. You have a fever. You have pain or swelling in your joints. You have a rash. Your symptoms occur on only one side of your body. Get help right away if: Your fingers or toes turn black. You have severe pain in the affected areas. These symptoms may represent a serious problem that is an emergency. Do not wait to see if the symptoms will go away. Get medical help right away. Call your local emergency services (911 in the  [place] Do not drive yourself to the hospital. Summary Raynaud's phenomenon is a condition that affects the arteries that carry blood to the fingers, toes, ears, lips, nipples, or the tip of the nose. In many cases, the cause of this condition is not known. Symptoms of this condition include changes in skin color along with numbness and tingling in the affected area. Treatment for this condition includes lifestyle changes and reducing exposure to cold temperatures. Medicines may be used for severe cases of the condition. Contact your health care provider if your  condition worsens despite treatment. This information is not intended to replace advice given to you by your health care provider. Make sure you discuss any questions you have with your health care provider. Document Revised: 03/25/2020 Document Reviewed: 03/25/2020 Elsevier Patient Education  2024 [employer].

## 2023-11-16 NOTE — Progress Notes (Signed)
 Advised pt of f/u recommendations per Dr. Pawar, as schedule is not yet that far in advance. NFN

## 2023-11-30 DIAGNOSIS — I872 Venous insufficiency (chronic) (peripheral): Secondary | ICD-10-CM | POA: Diagnosis not present

## 2023-11-30 DIAGNOSIS — R600 Localized edema: Secondary | ICD-10-CM | POA: Diagnosis not present

## 2023-11-30 DIAGNOSIS — I890 Lymphedema, not elsewhere classified: Secondary | ICD-10-CM | POA: Diagnosis not present

## 2023-12-12 DIAGNOSIS — I890 Lymphedema, not elsewhere classified: Secondary | ICD-10-CM | POA: Diagnosis not present

## 2023-12-12 DIAGNOSIS — I83892 Varicose veins of left lower extremities with other complications: Secondary | ICD-10-CM | POA: Diagnosis not present

## 2023-12-12 DIAGNOSIS — I872 Venous insufficiency (chronic) (peripheral): Secondary | ICD-10-CM | POA: Diagnosis not present

## 2023-12-12 DIAGNOSIS — R600 Localized edema: Secondary | ICD-10-CM | POA: Diagnosis not present

## 2023-12-15 DIAGNOSIS — I83892 Varicose veins of left lower extremities with other complications: Secondary | ICD-10-CM | POA: Diagnosis not present

## 2023-12-19 DIAGNOSIS — Z8639 Personal history of other endocrine, nutritional and metabolic disease: Secondary | ICD-10-CM | POA: Diagnosis not present

## 2023-12-19 DIAGNOSIS — E538 Deficiency of other specified B group vitamins: Secondary | ICD-10-CM | POA: Diagnosis not present

## 2023-12-19 DIAGNOSIS — Z0000 Encounter for general adult medical examination without abnormal findings: Secondary | ICD-10-CM | POA: Diagnosis not present

## 2023-12-19 DIAGNOSIS — E559 Vitamin D deficiency, unspecified: Secondary | ICD-10-CM | POA: Diagnosis not present

## 2023-12-19 DIAGNOSIS — E039 Hypothyroidism, unspecified: Secondary | ICD-10-CM | POA: Diagnosis not present

## 2023-12-19 DIAGNOSIS — Z13220 Encounter for screening for lipoid disorders: Secondary | ICD-10-CM | POA: Diagnosis not present

## 2023-12-22 DIAGNOSIS — I87392 Chronic venous hypertension (idiopathic) with other complications of left lower extremity: Secondary | ICD-10-CM | POA: Diagnosis not present

## 2024-01-11 ENCOUNTER — Encounter: Payer: Self-pay | Admitting: Podiatry

## 2024-01-11 ENCOUNTER — Other Ambulatory Visit: Payer: Self-pay | Admitting: Podiatry

## 2024-01-11 MED ORDER — MELOXICAM 15 MG PO TABS: 15.0000 mg | ORAL_TABLET | Freq: Every day | ORAL | 1 refills | Status: AC | PRN

## 2024-01-11 NOTE — Telephone Encounter (Signed)
 Please schedule follow-up.  Thanks

## 2024-01-12 ENCOUNTER — Encounter: Payer: Self-pay | Admitting: Podiatry

## 2024-01-12 ENCOUNTER — Ambulatory Visit: Admitting: Podiatry

## 2024-01-12 DIAGNOSIS — M722 Plantar fascial fibromatosis: Secondary | ICD-10-CM

## 2024-01-12 DIAGNOSIS — M19071 Primary osteoarthritis, right ankle and foot: Secondary | ICD-10-CM

## 2024-01-12 NOTE — Progress Notes (Unsigned)
° °  Patient seen by Dr. Gershon who recommended custom molded orthotics-  I saw for evaluation/ scan for custom molded foot orthotics.  Patient will benefit from custom foot orthotics to provide total contact to bilateral medial longitudinal arches to help balance and distribute body weight more evenly.  Thus reducing plantar pressure and pain.   Orthotic will encourage forefoot and rearfoot alignment.    Patient was scanned today with OHI scanner.    Orthotics are ordered.  Signature obtained for notification of pricing/ fees for the device.  When the orthotic is ready for pick up, we will call to make an appointment for a fitting.    Hasna Stefanik, DPM

## 2024-01-12 NOTE — Progress Notes (Unsigned)
 Subjective:  Patient ID: Michele Mccarthy, female    DOB: 10-Aug-1970,  MRN: [id number] Chief Complaint  Patient presents with   Plantar Fasciitis    Rm12 Right foot post heel pain for 5 months with throbbing aching pains/no treatment    History of Present Illness           Michele Mccarthy is a 53 year old female who presents with bilateral foot pain.  Most of the pain on the right foot for the last 5 months and she has pain to the bottom as well as the back of the heel.  She did get the refill of meloxicam  that she asked for.  No recent injuries or changes otherwise.  She was getting pain on the tops of the feet but this resolved.  Previous history: She has been experiencing bilateral foot pain since December, which she attributes to overuse and possibly wearing the wrong shoes. The pain is primarily located on the tops and sides of her feet and is exacerbated by wearing any type of shoes. She describes difficulty walking due to the pain. She has a history of arthritis and pronation, which she believes may be contributing to her foot issues. She has previously experienced plantar fasciitis and bursitis, which she attributes to ill-fitting shoes and overuse. She has not received any formal treatment for her current foot pain but has used over-the-counter topical treatments for relief.  She has tried various shoe inserts and new shoes, including Atrex and Brooks, to alleviate her symptoms, but continues to experience discomfort. She has also used over-the-counter insoles but finds them inadequate for her needs as it causes rubbing to the tops of the shoes.    Objective:    Physical Exam         General: AAO x3, NAD  Dermatological: Mild incurvation of the lateral hallux toenail without any edema, erythema, drainage or pus signs of infection.  The majority of tenderness that she has along the proximal nail fold at times but currently no pain.  No signs of infection.  Vascular:  Dorsalis Pedis artery and Posterior Tibial artery pedal pulses are 2/4 bilateral with immedate capillary fill time. There is no pain with calf compression, swelling, warmth, erythema.   Neruologic: Grossly intact via light touch bilateral.  Musculoskeletal: Unable to appreciate any areas on the tenderness along the Lisfranc joint or any areas of pinpoint tenderness.  She gets discomfort more on the plantar aspect the heel the insertion of the plantar fascia on the right side and somewhat of the posterior aspect.  There is no pain on the Achilles tendon.  There is no pain with lateral compression of the calcaneus.  There is no edema, erythema.  Gait: Unassisted, Nonantalgic.    No images are attached to the encounter.    Results          Assessment:   Plantar fasciitis, Achilles tendinitis  Plan:  Patient was evaluated and treated and all questions answered.  Assessment and Plan           Foot Pain -She is continue have ongoing chronic foot pain.  We discussed steroid injection to the inferior aspect of heel and she wishes to proceed.  See procedure note below.  Discussed stretching, icing a regular basis.  Anti-inflammatories as needed.  Given ongoing foot discomfort discussed orthotics and she wants to proceed with this.  See separate note for this.  On the way out after the injection today she states that her foot  already felt better.  Procedure: Injection Tendon/Ligament Discussed alternatives, risks, complications and verbal consent was obtained.  Location: Right plantar fascia at the glabrous junction; medial approach. Skin Prep: Alcohol. Injectate: 0.5cc 0.5% marcaine plain, 0.5 cc 2% lidocaine  plain and, 1 cc kenalog 10. Disposition: Patient tolerated procedure well. Injection site dressed with a band-aid.  Post-injection care was discussed and return precautions discussed.   No follow-ups on file.  Donnice Mccarthy Fees DPM

## 2024-01-12 NOTE — Patient Instructions (Signed)

## 2024-01-19 ENCOUNTER — Ambulatory Visit: Admitting: Podiatry

## 2024-01-20 DIAGNOSIS — D649 Anemia, unspecified: Secondary | ICD-10-CM | POA: Diagnosis not present

## 2024-01-30 DIAGNOSIS — I890 Lymphedema, not elsewhere classified: Secondary | ICD-10-CM | POA: Diagnosis not present

## 2024-01-30 DIAGNOSIS — R600 Localized edema: Secondary | ICD-10-CM | POA: Diagnosis not present

## 2024-01-30 DIAGNOSIS — I872 Venous insufficiency (chronic) (peripheral): Secondary | ICD-10-CM | POA: Diagnosis not present

## 2024-01-30 DIAGNOSIS — I87392 Chronic venous hypertension (idiopathic) with other complications of left lower extremity: Secondary | ICD-10-CM | POA: Diagnosis not present

## 2024-02-07 ENCOUNTER — Telehealth: Payer: Self-pay

## 2024-02-07 NOTE — Telephone Encounter (Signed)
 Orthotics are in the [place] office. Spoke to Michele Mccarthy and she is scheduled to [facility] on 02/13/2024

## 2024-02-13 ENCOUNTER — Ambulatory Visit (INDEPENDENT_AMBULATORY_CARE_PROVIDER_SITE_OTHER): Admitting: Podiatrist

## 2024-02-13 DIAGNOSIS — M722 Plantar fascial fibromatosis: Secondary | ICD-10-CM

## 2024-02-13 NOTE — Progress Notes (Signed)
 ORTHOTIC DISPENSING:   Reason for Visit:         Fitting and Delivery of Custom Fabricated Foot Orthoses Patient Report:            Patient reports comfort and is satisfied with device.   OBJECTIVE DATA: Patient History / Diagnosis:    No change in pathology Provided Device:                     Functional foot orthoses   GOAL OF ORTHOSIS - Improve gait - Decrease energy expenditure - Improve Balance - Provide Triplanar stability of foot complex - Facilitate motion   ACTIONS PERFORMED Patient was fit with custom foot orthoses   Patient was provided with verbal and written instruction and demonstration regarding wear, care, proper fit, function, and use of the orthosis.    Patient was also provided with verbal instruction regarding how to report any failures or malfunctions of the orthosis and necessary follow up care. Patient was also instructed to contact our office regarding any change in status that may affect the function of the orthosis.   Patient demonstrated understanding of all instructions.  Michele Mccarthy, DPM    Patient requested a second pair be ordered.  I notified her of the cost (50% off the second pair) and she would like me to order.  Will call when ready for pick up.

## 2024-02-24 ENCOUNTER — Telehealth: Payer: Self-pay

## 2024-02-24 DIAGNOSIS — M722 Plantar fascial fibromatosis: Secondary | ICD-10-CM

## 2024-02-24 NOTE — Telephone Encounter (Signed)
 2nd pair of orthotics are in the [place] office. Spoke to Lucilla and she will come by the office next week to pick up.

## 2024-11-09 ENCOUNTER — Ambulatory Visit: Admitting: Rheumatology
